# Patient Record
Sex: Female | Born: 1970
Health system: Southern US, Community
[De-identification: ages and names within clinical notes are randomized; demographics above are authoritative.]

## PROBLEM LIST (undated history)

## (undated) DIAGNOSIS — T7840XA Allergy, unspecified, initial encounter: Secondary | ICD-10-CM

## (undated) DIAGNOSIS — K589 Irritable bowel syndrome without diarrhea: Secondary | ICD-10-CM

## (undated) DIAGNOSIS — F32A Depression, unspecified: Secondary | ICD-10-CM

## (undated) DIAGNOSIS — F419 Anxiety disorder, unspecified: Secondary | ICD-10-CM

## (undated) DIAGNOSIS — N3010 Interstitial cystitis (chronic) without hematuria: Secondary | ICD-10-CM

## (undated) DIAGNOSIS — N200 Calculus of kidney: Secondary | ICD-10-CM

## (undated) DIAGNOSIS — Z87442 Personal history of urinary calculi: Secondary | ICD-10-CM

## (undated) DIAGNOSIS — Z8742 Personal history of other diseases of the female genital tract: Secondary | ICD-10-CM

## (undated) DIAGNOSIS — U071 COVID-19: Secondary | ICD-10-CM

## (undated) DIAGNOSIS — Z62810 Personal history of physical and sexual abuse in childhood: Secondary | ICD-10-CM

## (undated) DIAGNOSIS — Z86010 Personal history of colonic polyps: Secondary | ICD-10-CM

## (undated) DIAGNOSIS — M1990 Unspecified osteoarthritis, unspecified site: Secondary | ICD-10-CM

## (undated) DIAGNOSIS — D649 Anemia, unspecified: Secondary | ICD-10-CM

## (undated) DIAGNOSIS — Z8669 Personal history of other diseases of the nervous system and sense organs: Secondary | ICD-10-CM

## (undated) DIAGNOSIS — K219 Gastro-esophageal reflux disease without esophagitis: Secondary | ICD-10-CM

## (undated) DIAGNOSIS — F329 Major depressive disorder, single episode, unspecified: Secondary | ICD-10-CM

## (undated) HISTORY — DX: Irritable bowel syndrome, unspecified: K58.9

## (undated) HISTORY — DX: Depression, unspecified: F32.A

## (undated) HISTORY — DX: Unspecified osteoarthritis, unspecified site: M19.90

## (undated) HISTORY — DX: Gastro-esophageal reflux disease without esophagitis: K21.9

## (undated) HISTORY — DX: Personal history of physical and sexual abuse in childhood: Z62.810

## (undated) HISTORY — DX: Interstitial cystitis (chronic) without hematuria: N30.10

## (undated) HISTORY — DX: Personal history of other diseases of the female genital tract: Z87.42

## (undated) HISTORY — PX: EXPLORATORY LAPAROTOMY: SUR591

## (undated) HISTORY — DX: Calculus of kidney: N20.0

## (undated) HISTORY — PX: ABDOMINAL HYSTERECTOMY: SHX81

## (undated) HISTORY — DX: Personal history of colonic polyps: Z86.010

## (undated) HISTORY — DX: Anemia, unspecified: D64.9

## (undated) HISTORY — PX: UPPER GASTROINTESTINAL ENDOSCOPY: SHX188

## (undated) HISTORY — DX: Major depressive disorder, single episode, unspecified: F32.9

## (undated) HISTORY — PX: ESOPHAGOGASTRODUODENOSCOPY: SHX1529

## (undated) HISTORY — DX: COVID-19: U07.1

## (undated) HISTORY — DX: Personal history of other diseases of the nervous system and sense organs: Z86.69

## (undated) HISTORY — PX: TUBAL LIGATION: SHX77

## (undated) HISTORY — DX: Allergy, unspecified, initial encounter: T78.40XA

## (undated) HISTORY — DX: Anxiety disorder, unspecified: F41.9

## (undated) HISTORY — PX: CHOLECYSTECTOMY: SHX55

## (undated) HISTORY — PX: OTHER SURGICAL HISTORY: SHX169

## (undated) HISTORY — PX: WISDOM TOOTH EXTRACTION: SHX21

---

## 1997-12-19 ENCOUNTER — Other Ambulatory Visit: Admission: RE | Admit: 1997-12-19 | Discharge: 1997-12-19 | Payer: Self-pay | Admitting: *Deleted

## 2008-11-08 HISTORY — PX: ABDOMINAL HYSTERECTOMY: SUR658

## 2009-11-23 ENCOUNTER — Ambulatory Visit (HOSPITAL_COMMUNITY): Admission: RE | Admit: 2009-11-23 | Discharge: 2009-11-23 | Payer: Self-pay | Admitting: Family Medicine

## 2009-11-26 ENCOUNTER — Ambulatory Visit (HOSPITAL_BASED_OUTPATIENT_CLINIC_OR_DEPARTMENT_OTHER): Admission: RE | Admit: 2009-11-26 | Discharge: 2009-11-26 | Payer: Self-pay | Admitting: Urology

## 2010-01-04 ENCOUNTER — Encounter (INDEPENDENT_AMBULATORY_CARE_PROVIDER_SITE_OTHER): Payer: Self-pay | Admitting: Urology

## 2010-01-04 ENCOUNTER — Inpatient Hospital Stay (HOSPITAL_COMMUNITY): Admission: RE | Admit: 2010-01-04 | Discharge: 2010-01-05 | Payer: Self-pay | Admitting: Urology

## 2010-05-28 ENCOUNTER — Ambulatory Visit (HOSPITAL_COMMUNITY): Admission: RE | Admit: 2010-05-28 | Discharge: 2010-05-28 | Payer: Self-pay | Admitting: Urology

## 2010-08-01 ENCOUNTER — Encounter: Payer: Self-pay | Admitting: Family Medicine

## 2010-09-26 LAB — BASIC METABOLIC PANEL
BUN: 3 mg/dL — ABNORMAL LOW (ref 6–23)
BUN: 4 mg/dL — ABNORMAL LOW (ref 6–23)
BUN: 9 mg/dL (ref 6–23)
CO2: 29 mEq/L (ref 19–32)
CO2: 30 mEq/L (ref 19–32)
CO2: 32 mEq/L (ref 19–32)
Calcium: 8.6 mg/dL (ref 8.4–10.5)
Calcium: 8.6 mg/dL (ref 8.4–10.5)
Calcium: 9.7 mg/dL (ref 8.4–10.5)
Chloride: 101 mEq/L (ref 96–112)
Chloride: 107 mEq/L (ref 96–112)
Chloride: 107 mEq/L (ref 96–112)
Creatinine, Ser: 0.65 mg/dL (ref 0.4–1.2)
Creatinine, Ser: 0.79 mg/dL (ref 0.4–1.2)
Creatinine, Ser: 0.82 mg/dL (ref 0.4–1.2)
GFR calc Af Amer: >60 mL/min (ref >60)
GFR calc Af Amer: >60 mL/min (ref >60)
GFR calc Af Amer: >60 mL/min (ref >60)
GFR calc non Af Amer: >60 mL/min (ref >60)
GFR calc non Af Amer: >60 mL/min (ref >60)
GFR calc non Af Amer: >60 mL/min (ref >60)
Glucose, Bld: 142 mg/dL — ABNORMAL HIGH (ref 70–99)
Glucose, Bld: 180 mg/dL — ABNORMAL HIGH (ref 70–99)
Glucose, Bld: 86 mg/dL (ref 70–99)
Potassium: 3.8 mEq/L (ref 3.5–5.1)
Potassium: 3.8 mEq/L (ref 3.5–5.1)
Potassium: 4.1 mEq/L (ref 3.5–5.1)
Sodium: 140 mEq/L (ref 135–145)
Sodium: 141 mEq/L (ref 135–145)
Sodium: 142 mEq/L (ref 135–145)

## 2010-09-26 LAB — CBC
HCT: 39 % (ref 36.0–46.0)
Hemoglobin: 12.8 g/dL (ref 12.0–15.0)
MCHC: 32.7 g/dL (ref 30.0–36.0)
MCV: 89.4 fL (ref 78.0–100.0)
Platelets: 275 10*3/uL (ref 150–400)
RBC: 4.36 MIL/uL (ref 3.87–5.11)
RDW: 14.2 % (ref 11.5–15.5)
WBC: 8 10*3/uL (ref 4.0–10.5)

## 2010-09-26 LAB — HEMOGLOBIN AND HEMATOCRIT, BLOOD
HCT: 33.3 % — ABNORMAL LOW (ref 36.0–46.0)
HCT: 34.4 % — ABNORMAL LOW (ref 36.0–46.0)
Hemoglobin: 11.4 g/dL — ABNORMAL LOW (ref 12.0–15.0)
Hemoglobin: 11.6 g/dL — ABNORMAL LOW (ref 12.0–15.0)

## 2010-09-26 LAB — CREATININE, FLUID (PLEURAL, PERITONEAL, JP DRAINAGE): Creat, Fluid: 0.6 mg/dL

## 2010-09-26 LAB — ABO/RH: ABO/RH(D): A POS

## 2010-09-26 LAB — TYPE AND SCREEN
ABO/RH(D): A POS
Antibody Screen: NEGATIVE

## 2010-09-26 LAB — SURGICAL PCR SCREEN
MRSA, PCR: NEGATIVE
Staphylococcus aureus: NEGATIVE

## 2010-09-27 LAB — POCT HEMOGLOBIN-HEMACUE: Hemoglobin: 12.2 g/dL (ref 12.0–15.0)

## 2012-02-28 ENCOUNTER — Encounter: Payer: Self-pay | Admitting: Internal Medicine

## 2012-03-27 ENCOUNTER — Ambulatory Visit: Payer: Self-pay | Admitting: Internal Medicine

## 2012-03-30 ENCOUNTER — Ambulatory Visit: Payer: Self-pay | Admitting: Internal Medicine

## 2012-04-27 ENCOUNTER — Encounter: Payer: Self-pay | Admitting: Internal Medicine

## 2012-04-27 ENCOUNTER — Ambulatory Visit (INDEPENDENT_AMBULATORY_CARE_PROVIDER_SITE_OTHER): Payer: BC Managed Care – PPO | Admitting: Internal Medicine

## 2012-04-27 ENCOUNTER — Other Ambulatory Visit (INDEPENDENT_AMBULATORY_CARE_PROVIDER_SITE_OTHER): Payer: BC Managed Care – PPO

## 2012-04-27 VITALS — BP 102/78 | HR 68 | Wt 174.0 lb

## 2012-04-27 DIAGNOSIS — K2289 Other specified disease of esophagus: Secondary | ICD-10-CM

## 2012-04-27 DIAGNOSIS — K589 Irritable bowel syndrome without diarrhea: Secondary | ICD-10-CM

## 2012-04-27 DIAGNOSIS — K228 Other specified diseases of esophagus: Secondary | ICD-10-CM

## 2012-04-27 LAB — CBC WITH DIFFERENTIAL/PLATELET
Basophils Absolute: 0.1 10*3/uL (ref 0.0–0.1)
Basophils Relative: 1 % (ref 0.0–3.0)
Eosinophils Absolute: 0.1 10*3/uL (ref 0.0–0.7)
Eosinophils Relative: 1.6 % (ref 0.0–5.0)
HCT: 40.2 % (ref 36.0–46.0)
Hemoglobin: 13.2 g/dL (ref 12.0–15.0)
Lymphocytes Relative: 21 % (ref 12.0–46.0)
Lymphs Abs: 1.9 10*3/uL (ref 0.7–4.0)
MCHC: 32.9 g/dL (ref 30.0–36.0)
MCV: 85.6 fl (ref 78.0–100.0)
Monocytes Absolute: 0.6 10*3/uL (ref 0.1–1.0)
Monocytes Relative: 6.8 % (ref 3.0–12.0)
Neutro Abs: 6.2 10*3/uL (ref 1.4–7.7)
Neutrophils Relative %: 69.6 % (ref 43.0–77.0)
Platelets: 340 10*3/uL (ref 150.0–400.0)
RBC: 4.7 Mil/uL (ref 3.87–5.11)
RDW: 14.9 % — ABNORMAL HIGH (ref 11.5–14.6)
WBC: 8.8 10*3/uL (ref 4.5–10.5)

## 2012-04-27 LAB — IGA: IgA: 162 mg/dL (ref 68–378)

## 2012-04-27 MED ORDER — GLYCOPYRROLATE 2 MG PO TABS: 2.0000 mg | ORAL_TABLET | Freq: Two times a day (BID) | ORAL | Status: DC

## 2012-04-27 NOTE — Progress Notes (Signed)
Subjective:    Patient ID: Nancy Armstrong, female    DOB: [birth year], 41 y.o.   MRN: [id number] Referred by: Blane Ohara, MD  HPI This is a very pleasant 41 year old divorced white woman who presents with abdominal pain and esophageal issues.  She describes a chronic history of intermittent lower abdominal pain, associated with irregular bowel movements. She'll either be constipated and not move her bowels for multiple days or have multiple loose or soft bowel movements at times. She also frequently has a long gastrocolic reflex. In the past month or so things have been particularly worse. She feels crampy and tender in the lower abdomen bilaterally. She has some associated dizziness and weakness today. She has not had rectal bleeding. She tells me she had a CT urogram performed within the last few weeks that was unrevealing.  She also has a chronic 10 year history of intermittent spells of central chest pain that she says feels like a heart attack, and her associated with acute sensation of dysphagia though she's not swallowing. She has been on chronic PPI therapy including Nexium which she's on now Prilosec and other antacids without relief. However if she uses a chewable Pepcid a.c. when she gets one of these episodes she will get relief. She does not have trouble swallowing food.  Had an upper GI endoscopy about 10 years ago for that problem but she's never had lower GI endoscopy. She was seen in September 20 and treated for sinusitis. She also had a urine culture positive for Escherichia coli at that time. H. pylori antibody was negative. It was an IgA antibody.  She does have interstitial cystitis but says it is controlled with diet at this point and the symptoms she has now are nothing like what interstitial cystitis causes for her. Allergies  Allergen Reactions  . Ciprofloxacin   . Imitrex (Sumatriptan)     Paresthesia of extremities  . Relpax (Eletriptan)     Sore throat, swelling   .  Topamax (Topiramate)     Edema,sore throat  . Zomig (Zolmitriptan)     Swelling    Outpatient Prescriptions Prior to Visit  Medication Sig Dispense Refill  . butalbital-acetaminophen-caffeine (FIORICET) 50-325-40 MG per tablet Take 1 tablet by mouth every 4 (four) hours as needed.      . cyclobenzaprine (FLEXERIL) 5 MG tablet Take 5 mg by mouth 3 (three) times daily as needed.      Marland Kitchen esomeprazole (NEXIUM) 40 MG capsule Take 40 mg by mouth daily before breakfast.      . HYDROcodone-acetaminophen (VICODIN) 5-500 MG per tablet Take 1 tablet by mouth every 6 (six) hours as needed.      . meloxicam (MOBIC) 15 MG tablet Take 15 mg by mouth daily.      . promethazine (PHENERGAN) 25 MG tablet Take 25 mg by mouth every 6 (six) hours as needed.      . sertraline (ZOLOFT) 100 MG tablet Take 100 mg by mouth daily.      Marland Kitchen zolpidem (AMBIEN) 5 MG tablet Take 5 mg by mouth at bedtime as needed.       Past Medical History  Diagnosis Date  . Hx of migraine headaches   . Renal stones   . Hx of endometriosis   . Arthritis   . Interstitial cystitis    Past Surgical History  Procedure Date  . Cholecystectomy   . Tubal ligation   . Exploratory laparotomy     ovarian cyst/endometriosis  . Upj  reconstruction   . Renal stone extraction   . Abdominal hysterectomy 11/2008    BSO  . Esophagogastroduodenoscopy     [name]   History   Social History  . Marital Status: Divorced    Spouse Name: N/A    Number of Children: 3  . Years of Education: N/A   Occupational History  . Multimedia programmer   Social History Main Topics  . Smoking status: Never Smoker   . Smokeless tobacco: Never Used  . Alcohol Use: Yes  . Drug Use: No  .            Social History Narrative   Divorced, 2 sons and 1 daughter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   Family History  Problem Relation Age of Onset  . Hypertension Mother   . Lung cancer Paternal Grandmother   . Liver cancer Maternal Grandmother   .  Hypertension Father   . Lung cancer Maternal Grandmother         Review of Systems This is positive for fatigue intermittent headaches tinnitus insomnia and arthritis pains. All other review of systems are negative.    Objective:   Physical Exam General:  Well-developed, well-nourished and in no acute distress Eyes:  anicteric. ENT:   Mouth and posterior pharynx free of lesions.  Neck:   supple w/o thyromegaly or mass.  Lungs: Clear to auscultation bilaterally. Heart:  S1S2, no rubs, murmurs, gallops. Abdomen:  soft, diffusely tender but especially in the lower quadrants were there is mild to moderate tenderness., no hepatosplenomegaly, hernia, or mass and BS+. No rebound or guarding. Lymph:  no cervical or supraclavicular adenopathy. Extremities:   no edema Skin   no rash. Neuro:  A&O x 3.  Psych:  appropriate mood and  Affect.   Data Reviewed: As per history of present illness. Will obtain copy of the CT report.     Assessment & Plan:   1. IBS (irritable bowel syndrome)   I think her story is classic for this. There is an outside chance she could have something like celiac disease or inflammatory bowel disease but seems unlikely. It is quite common have IBS in conjunction with interstitial cystitis.   2. Esophageal pain   I'm not sure what this is but she may have intermittent esophageal spasm. She has dealt with this for many years and has reliable relief with intermittent chewable Pepcid. She only gets symptoms a few times a year it sounds like. We'll observe at this point and focus on #1.    1. CBC, IgA level and tissue transglutaminase level. 2. Start glycopyrrolate 2 mg twice daily 3. Fiber supplementation with psyllium 1 teaspoon daily increasing to 1 tablespoon daily 4. Return to me in 6 weeks, call back sooner if needed 5. IBS handout provided  I appreciate the opportunity to care for this patient.   CC: Blane Ohara, MD

## 2012-04-27 NOTE — Patient Instructions (Addendum)
Your physician has requested that you go to the basement for the following lab work before leaving today: CBC/diff, IGA, TTG  Start over the counter Metamucil :  Take one teaspoon every day and build up to one tablespoon every day over the next week.  We have sent the following medications to your pharmacy for you to pick up at your convenience: Robinul  We will obtain the CT scan results with the ROI you signed today.  Thank you for choosing me and [facility].  Iva Boop, M.D., [facility]

## 2012-04-30 LAB — TISSUE TRANSGLUTAMINASE, IGA: Tissue Transglutaminase Ab, IgA: 2.5 U/mL (ref <20)

## 2012-05-01 NOTE — Progress Notes (Signed)
Quick Note:  Test is negative for celiac disease ______

## 2012-10-29 ENCOUNTER — Encounter: Payer: Self-pay | Admitting: Gastroenterology

## 2012-10-29 ENCOUNTER — Ambulatory Visit (HOSPITAL_COMMUNITY)
Admission: RE | Admit: 2012-10-29 | Discharge: 2012-10-29 | Disposition: A | Discharge status: Home / Self Care | Payer: BC Managed Care – PPO | Source: Ambulatory Visit | Attending: Gastroenterology | Admitting: Gastroenterology

## 2012-10-29 ENCOUNTER — Telehealth: Payer: Self-pay | Admitting: Internal Medicine

## 2012-10-29 ENCOUNTER — Encounter: Payer: Self-pay | Admitting: *Deleted

## 2012-10-29 ENCOUNTER — Telehealth: Payer: Self-pay | Admitting: Gastroenterology

## 2012-10-29 ENCOUNTER — Ambulatory Visit (INDEPENDENT_AMBULATORY_CARE_PROVIDER_SITE_OTHER): Payer: BC Managed Care – PPO | Admitting: Gastroenterology

## 2012-10-29 ENCOUNTER — Other Ambulatory Visit: Payer: BC Managed Care – PPO

## 2012-10-29 VITALS — BP 110/70 | HR 80 | Ht 64.0 in | Wt 186.8 lb

## 2012-10-29 DIAGNOSIS — R7989 Other specified abnormal findings of blood chemistry: Secondary | ICD-10-CM

## 2012-10-29 DIAGNOSIS — R0789 Other chest pain: Secondary | ICD-10-CM

## 2012-10-29 DIAGNOSIS — R1013 Epigastric pain: Secondary | ICD-10-CM

## 2012-10-29 DIAGNOSIS — Z9089 Acquired absence of other organs: Secondary | ICD-10-CM | POA: Insufficient documentation

## 2012-10-29 DIAGNOSIS — N281 Cyst of kidney, acquired: Secondary | ICD-10-CM | POA: Insufficient documentation

## 2012-10-29 LAB — HEPATIC FUNCTION PANEL
ALT: 193 U/L — ABNORMAL HIGH (ref 0–35)
AST: 53 U/L — ABNORMAL HIGH (ref 0–37)
Albumin: 4.4 g/dL (ref 3.5–5.2)
Alkaline Phosphatase: 157 U/L — ABNORMAL HIGH (ref 39–117)
Bilirubin, Direct: 0.1 mg/dL (ref 0.0–0.3)
Total Bilirubin: 0.4 mg/dL (ref 0.3–1.2)
Total Protein: 8.2 g/dL (ref 6.0–8.3)

## 2012-10-29 NOTE — Telephone Encounter (Signed)
Spoke with patient.  Informed her that liver tests have come down significantly, but not yet normal.  Ultrasound is normal, however, we strongly suspect that she passed a gallstone.  Plan is to see her back in two weeks with LFT's drawn prior to visit.  She is to slowly start back on a diet, advancing as tolerated.  She was told to have her LFT's drawn if she ever experiences this pain again.  If she has documented recurrence, then could argue for ERCP/EUS with sphincterotomy.  Patient is in agreement.

## 2012-10-29 NOTE — Patient Instructions (Addendum)
You have been scheduled for an abdominal ultrasound at [facility] (1st floor of hospital) on TODAY at 2:30PM. Please arrive 15 minutes prior to your appointment for registration. Make certain not to have anything to eat or drink 6 hours prior to your appointment. Should you need to reschedule your appointment, please contact radiology at [phone]. This test typically takes about 30 minutes to perform.  Go to the basement for stat labs today

## 2012-10-29 NOTE — Progress Notes (Addendum)
10/29/2012 Nancy Armstrong [id number] 1970/07/29   History of Present Illness:  Patient is a 42 year old female who is a patient of Dr. Marvell Fuller who presents to our office today for follow-up after an ER visit on 4/17.  She says that early that morning around 230 am she woke up from chest and upper abdominal pains that reached an 8/10 in severity.  She had similar symptoms in the past and has been treated for reflux; she tried taking the typical medications that she would use with previous episodes, however, nothing was helping and that was unusual.  Pain was across her upper abdomen, into her chest, and radiating to the right side of her back.  She had some nausea but no vomiting.  Went to the ED and was found to have a mild leukocytosis along with very elevated LFT's (AST 675, ALT 365, and ALP 162 but normal bili).  Lipase was normal.  She was given phenergan and pain medication along with prilosec to begin taking daily.  She says that the pain has subsided over the past couple of days, but her abdomen is still sore and she just does not feel well.  She saw her PCP this AM and was given samples of Dexilant to begin taking in place of the prilosec.  She was given more pain medication.  Labs were drawn for Lyme's disease, RMSF, and Malachi Carl.  Her acute hepatitis panel was negative.  Current Medications, Allergies, Past Medical History, Past Surgical History, Family History and Social History were reviewed in [employer] record.   Physical Exam: Wt 186 lb 12.8 oz (84.732 kg) General: Well developed, white female in no acute distress Head: Normocephalic and atraumatic Eyes:  sclerae anicteric, conjunctiva pink  Ears: Normal auditory acuity Lungs: Clear throughout to auscultation Heart: Regular rate and rhythm Abdomen: Soft, non tender and non distended. No masses, no hepatomegaly. Normal bowel sounds Musculoskeletal: Symmetrical with no gross deformities  Extremities: No  edema  Neurological: Alert oriented x 4, grossly nonfocal Psychological:  Alert and cooperative. Normal mood and affect  Assessment and Recommendations: -Chest pain and epigastric abdominal pain:  She thought that it was reflux related, but was worse and different than the pain that she had with the reflux previously (pain on this occasion also not relieved by her usually treatment).   -Elevated LFT's:  ? If this is related to or independent of the abdominal pain that she was/is having.  She is on antibiotics (was on zithromax for two days and now on omnicef)--? If one of these caused hepatotoxicity  *Rule out choledocholithiasis.  We will recheck STAT LFT's this morning and schedule an ultrasound for today. Further evaluation and treatment pending the results of LFT's and ultrasound.  In the interim, she will begin the Dexilant samples that she was given by her PCP; will continue anti-emetics and pain medication as needed.   Addendum: Reviewed and agree with initial management. Agree with PPI trial. Given symptoms and LFTs, but initially impression is that all are related. Await [place], consider MRCP if symptoms persist. Trend LFTs Beverley Fiedler, MD

## 2012-10-29 NOTE — Telephone Encounter (Signed)
Patient was seen in the [facility] ER on Friday with a reflux attack and chest pain.  She will come in this am and see Doug Sou, PA at 10:45

## 2012-10-30 ENCOUNTER — Other Ambulatory Visit: Payer: Self-pay | Admitting: *Deleted

## 2012-10-30 ENCOUNTER — Telehealth: Payer: Self-pay | Admitting: Gastroenterology

## 2012-10-30 ENCOUNTER — Other Ambulatory Visit: Payer: BC Managed Care – PPO

## 2012-10-30 DIAGNOSIS — R1011 Right upper quadrant pain: Secondary | ICD-10-CM

## 2012-10-30 DIAGNOSIS — R7989 Other specified abnormal findings of blood chemistry: Secondary | ICD-10-CM

## 2012-10-30 DIAGNOSIS — R109 Unspecified abdominal pain: Secondary | ICD-10-CM

## 2012-10-30 LAB — HEPATIC FUNCTION PANEL
ALT: 179 U/L — ABNORMAL HIGH (ref 0–35)
AST: 68 U/L — ABNORMAL HIGH (ref 0–37)
Albumin: 4.2 g/dL (ref 3.5–5.2)
Alkaline Phosphatase: 155 U/L — ABNORMAL HIGH (ref 39–117)
Bilirubin, Direct: 0 mg/dL (ref 0.0–0.3)
Total Bilirubin: 0.5 mg/dL (ref 0.3–1.2)
Total Protein: 7.9 g/dL (ref 6.0–8.3)

## 2012-10-30 NOTE — Telephone Encounter (Signed)
You have been scheduled for an MRI at [facility] on 11/02/2012. Your appointment time is 1PM. Please arrive 15 minutes prior to your appointment time for registration purposes. There is no prep for this test. However, if you have any metal in your body, have a pacemaker or defibrillator, please be sure to let your ordering physician know. This test typically takes 45 minutes to 1 hour to complete. NOTHING TO EAT OR DRINK 4 HOURS BEFORE

## 2012-10-30 NOTE — Telephone Encounter (Signed)
Nancy Armstrong,       I spoke with patient and gave her results and plan. Could you please put a standing order in the system for LFT's for her. Also, she needs a follow-up with me in two weeks; could you please schedule that for her as well and make her aware of appointment date and time? She is supposed to have LFT's drawn prior to that visit as well.      Thank you,      Jess      Attached to   Jess, I just got your message...., What do you want to do about this She is having a lot of pain see message

## 2012-10-30 NOTE — Telephone Encounter (Signed)
PT ON THE WAY HERE TO HAVE THEM DRAWN. DID NOT WANT TO GO TO PCP TO HAVE THEM DRAWN

## 2012-10-30 NOTE — Telephone Encounter (Signed)
Recheck LFT's this morning.

## 2012-10-30 NOTE — Telephone Encounter (Signed)
PT WAITING IN LOBBY Nancy Armstrong

## 2012-11-01 ENCOUNTER — Telehealth: Payer: Self-pay | Admitting: Gastroenterology

## 2012-11-01 NOTE — Telephone Encounter (Signed)
Spoke with patient and her PCP's lab work was positive for mono. Per Shanda Bumps cancel MRI. Cancelled MRI. Patient aware.

## 2012-11-01 NOTE — Telephone Encounter (Signed)
Spoke with Doug Sou, PA. If patient is being treated for acute mono, this could account for symptoms and lab values. May cancel MRI. Left a message for patient to call me.

## 2012-11-02 ENCOUNTER — Ambulatory Visit (HOSPITAL_COMMUNITY): Admission: RE | Admit: 2012-11-02 | Payer: BC Managed Care – PPO | Source: Ambulatory Visit

## 2013-05-16 ENCOUNTER — Other Ambulatory Visit: Payer: Self-pay

## 2014-04-25 ENCOUNTER — Other Ambulatory Visit: Payer: Self-pay

## 2016-08-11 DIAGNOSIS — J029 Acute pharyngitis, unspecified: Secondary | ICD-10-CM | POA: Diagnosis not present

## 2016-08-11 DIAGNOSIS — Z20828 Contact with and (suspected) exposure to other viral communicable diseases: Secondary | ICD-10-CM | POA: Diagnosis not present

## 2016-08-11 DIAGNOSIS — J069 Acute upper respiratory infection, unspecified: Secondary | ICD-10-CM | POA: Diagnosis not present

## 2016-08-11 DIAGNOSIS — M5090 Cervical disc disorder, unspecified, unspecified cervical region: Secondary | ICD-10-CM | POA: Diagnosis not present

## 2016-08-11 DIAGNOSIS — M5412 Radiculopathy, cervical region: Secondary | ICD-10-CM | POA: Diagnosis not present

## 2016-08-11 DIAGNOSIS — R6889 Other general symptoms and signs: Secondary | ICD-10-CM | POA: Diagnosis not present

## 2016-08-11 DIAGNOSIS — M545 Low back pain: Secondary | ICD-10-CM | POA: Diagnosis not present

## 2016-08-11 DIAGNOSIS — M47812 Spondylosis without myelopathy or radiculopathy, cervical region: Secondary | ICD-10-CM | POA: Diagnosis not present

## 2016-08-20 DIAGNOSIS — J218 Acute bronchiolitis due to other specified organisms: Secondary | ICD-10-CM | POA: Diagnosis not present

## 2016-08-20 DIAGNOSIS — J0100 Acute maxillary sinusitis, unspecified: Secondary | ICD-10-CM | POA: Diagnosis not present

## 2016-08-26 DIAGNOSIS — R071 Chest pain on breathing: Secondary | ICD-10-CM | POA: Diagnosis not present

## 2016-08-26 DIAGNOSIS — M62838 Other muscle spasm: Secondary | ICD-10-CM | POA: Diagnosis not present

## 2016-08-26 DIAGNOSIS — K209 Esophagitis, unspecified: Secondary | ICD-10-CM | POA: Diagnosis not present

## 2016-09-12 DIAGNOSIS — M5412 Radiculopathy, cervical region: Secondary | ICD-10-CM | POA: Diagnosis not present

## 2016-09-12 DIAGNOSIS — M545 Low back pain: Secondary | ICD-10-CM | POA: Diagnosis not present

## 2016-09-12 DIAGNOSIS — M47812 Spondylosis without myelopathy or radiculopathy, cervical region: Secondary | ICD-10-CM | POA: Diagnosis not present

## 2016-09-12 DIAGNOSIS — M5090 Cervical disc disorder, unspecified, unspecified cervical region: Secondary | ICD-10-CM | POA: Diagnosis not present

## 2016-09-26 DIAGNOSIS — E860 Dehydration: Secondary | ICD-10-CM | POA: Diagnosis not present

## 2016-09-26 DIAGNOSIS — Z888 Allergy status to other drugs, medicaments and biological substances status: Secondary | ICD-10-CM | POA: Diagnosis not present

## 2016-09-26 DIAGNOSIS — N390 Urinary tract infection, site not specified: Secondary | ICD-10-CM | POA: Diagnosis not present

## 2016-09-26 DIAGNOSIS — Z87442 Personal history of urinary calculi: Secondary | ICD-10-CM | POA: Diagnosis not present

## 2016-09-26 DIAGNOSIS — Z87440 Personal history of urinary (tract) infections: Secondary | ICD-10-CM | POA: Diagnosis not present

## 2016-09-26 DIAGNOSIS — M797 Fibromyalgia: Secondary | ICD-10-CM | POA: Diagnosis not present

## 2016-09-26 DIAGNOSIS — N3000 Acute cystitis without hematuria: Secondary | ICD-10-CM | POA: Diagnosis not present

## 2016-09-26 DIAGNOSIS — N200 Calculus of kidney: Secondary | ICD-10-CM | POA: Diagnosis not present

## 2016-09-26 DIAGNOSIS — K567 Ileus, unspecified: Secondary | ICD-10-CM | POA: Diagnosis not present

## 2016-09-26 DIAGNOSIS — M479 Spondylosis, unspecified: Secondary | ICD-10-CM | POA: Diagnosis not present

## 2016-09-26 DIAGNOSIS — Z79899 Other long term (current) drug therapy: Secondary | ICD-10-CM | POA: Diagnosis not present

## 2016-09-26 DIAGNOSIS — G43909 Migraine, unspecified, not intractable, without status migrainosus: Secondary | ICD-10-CM | POA: Diagnosis not present

## 2016-09-26 DIAGNOSIS — R1033 Periumbilical pain: Secondary | ICD-10-CM | POA: Diagnosis not present

## 2016-09-26 DIAGNOSIS — Z881 Allergy status to other antibiotic agents status: Secondary | ICD-10-CM | POA: Diagnosis not present

## 2016-09-26 DIAGNOSIS — R109 Unspecified abdominal pain: Secondary | ICD-10-CM | POA: Diagnosis not present

## 2016-09-26 DIAGNOSIS — A084 Viral intestinal infection, unspecified: Secondary | ICD-10-CM | POA: Diagnosis not present

## 2016-09-26 DIAGNOSIS — K589 Irritable bowel syndrome without diarrhea: Secondary | ICD-10-CM | POA: Diagnosis not present

## 2016-09-27 DIAGNOSIS — N390 Urinary tract infection, site not specified: Secondary | ICD-10-CM | POA: Diagnosis not present

## 2016-09-27 DIAGNOSIS — R109 Unspecified abdominal pain: Secondary | ICD-10-CM | POA: Diagnosis not present

## 2016-09-27 DIAGNOSIS — E860 Dehydration: Secondary | ICD-10-CM | POA: Diagnosis not present

## 2016-09-28 DIAGNOSIS — N200 Calculus of kidney: Secondary | ICD-10-CM | POA: Diagnosis not present

## 2016-09-28 DIAGNOSIS — E860 Dehydration: Secondary | ICD-10-CM | POA: Diagnosis not present

## 2016-09-28 DIAGNOSIS — N390 Urinary tract infection, site not specified: Secondary | ICD-10-CM | POA: Diagnosis not present

## 2016-09-28 DIAGNOSIS — A084 Viral intestinal infection, unspecified: Secondary | ICD-10-CM | POA: Diagnosis not present

## 2016-09-28 DIAGNOSIS — K589 Irritable bowel syndrome without diarrhea: Secondary | ICD-10-CM

## 2016-10-11 DIAGNOSIS — M47812 Spondylosis without myelopathy or radiculopathy, cervical region: Secondary | ICD-10-CM | POA: Diagnosis not present

## 2016-10-11 DIAGNOSIS — M5412 Radiculopathy, cervical region: Secondary | ICD-10-CM | POA: Diagnosis not present

## 2016-10-11 DIAGNOSIS — G894 Chronic pain syndrome: Secondary | ICD-10-CM | POA: Diagnosis not present

## 2016-10-11 DIAGNOSIS — M5090 Cervical disc disorder, unspecified, unspecified cervical region: Secondary | ICD-10-CM | POA: Diagnosis not present

## 2016-10-11 DIAGNOSIS — M545 Low back pain: Secondary | ICD-10-CM | POA: Diagnosis not present

## 2016-10-16 ENCOUNTER — Emergency Department (HOSPITAL_COMMUNITY): Payer: BLUE CROSS/BLUE SHIELD

## 2016-10-16 ENCOUNTER — Encounter (HOSPITAL_COMMUNITY): Payer: Self-pay

## 2016-10-16 ENCOUNTER — Emergency Department (HOSPITAL_COMMUNITY)
Admission: EM | Admit: 2016-10-16 | Discharge: 2016-10-16 | Disposition: A | Discharge status: Home / Self Care | Payer: BLUE CROSS/BLUE SHIELD | Attending: Emergency Medicine | Admitting: Emergency Medicine

## 2016-10-16 DIAGNOSIS — R1013 Epigastric pain: Secondary | ICD-10-CM | POA: Diagnosis not present

## 2016-10-16 DIAGNOSIS — R0789 Other chest pain: Secondary | ICD-10-CM | POA: Insufficient documentation

## 2016-10-16 DIAGNOSIS — Z79899 Other long term (current) drug therapy: Secondary | ICD-10-CM | POA: Insufficient documentation

## 2016-10-16 DIAGNOSIS — R071 Chest pain on breathing: Secondary | ICD-10-CM | POA: Diagnosis present

## 2016-10-16 DIAGNOSIS — R079 Chest pain, unspecified: Secondary | ICD-10-CM | POA: Diagnosis not present

## 2016-10-16 LAB — CBC WITH DIFFERENTIAL/PLATELET
Basophils Absolute: 0.1 10*3/uL (ref 0.0–0.1)
Basophils Relative: 1 %
Eosinophils Absolute: 0.3 10*3/uL (ref 0.0–0.7)
Eosinophils Relative: 5 %
HCT: 38.3 % (ref 36.0–46.0)
Hemoglobin: 12.4 g/dL (ref 12.0–15.0)
Lymphocytes Relative: 28 %
Lymphs Abs: 1.9 10*3/uL (ref 0.7–4.0)
MCH: 27.6 pg (ref 26.0–34.0)
MCHC: 32.4 g/dL (ref 30.0–36.0)
MCV: 85.3 fL (ref 78.0–100.0)
Monocytes Absolute: 0.4 10*3/uL (ref 0.1–1.0)
Monocytes Relative: 5 %
Neutro Abs: 4.1 10*3/uL (ref 1.7–7.7)
Neutrophils Relative %: 61 %
Platelets: 305 10*3/uL (ref 150–400)
RBC: 4.49 MIL/uL (ref 3.87–5.11)
RDW: 14.4 % (ref 11.5–15.5)
WBC: 6.7 10*3/uL (ref 4.0–10.5)

## 2016-10-16 LAB — COMPREHENSIVE METABOLIC PANEL
ALT: 16 U/L (ref 14–54)
AST: 17 U/L (ref 15–41)
Albumin: 3.7 g/dL (ref 3.5–5.0)
Alkaline Phosphatase: 102 U/L (ref 38–126)
Anion gap: 8 (ref 5–15)
BUN: 10 mg/dL (ref 6–20)
CO2: 28 mmol/L (ref 22–32)
Calcium: 9.3 mg/dL (ref 8.9–10.3)
Chloride: 103 mmol/L (ref 101–111)
Creatinine, Ser: 0.88 mg/dL (ref 0.44–1.00)
GFR calc Af Amer: >60 mL/min (ref >60)
GFR calc non Af Amer: >60 mL/min (ref >60)
Glucose, Bld: 95 mg/dL (ref 65–99)
Potassium: 3.1 mmol/L — ABNORMAL LOW (ref 3.5–5.1)
Sodium: 139 mmol/L (ref 135–145)
Total Bilirubin: 0.3 mg/dL (ref 0.3–1.2)
Total Protein: 6.9 g/dL (ref 6.5–8.1)

## 2016-10-16 LAB — I-STAT TROPONIN, ED: Troponin i, poc: 0 ng/mL (ref 0.00–0.08)

## 2016-10-16 LAB — D-DIMER, QUANTITATIVE (NOT AT ARMC): D-Dimer, Quant: <0.27 ug/mL-FEU (ref 0.00–0.50)

## 2016-10-16 LAB — LIPASE, BLOOD: Lipase: 17 U/L (ref 11–51)

## 2016-10-16 MED ORDER — SUCRALFATE 1 GM/10ML PO SUSP: 1.0000 g | Freq: Three times a day (TID) | ORAL | 0 refills | Status: DC

## 2016-10-16 MED ORDER — HYDROMORPHONE HCL 1 MG/ML IJ SOLN: 1.0000 mg | Freq: Once | INTRAMUSCULAR | Status: DC

## 2016-10-16 MED ORDER — OMEPRAZOLE 40 MG PO CPDR: 40.0000 mg | DELAYED_RELEASE_CAPSULE | Freq: Every day | ORAL | 0 refills | Status: DC

## 2016-10-16 MED ORDER — GI COCKTAIL ~~LOC~~
30.0000 mL | Freq: Once | ORAL | Status: AC
  Administered 2016-10-16: 30 mL via ORAL
  Filled 2016-10-16: qty 30

## 2016-10-16 MED ORDER — FAMOTIDINE 40 MG PO TABS: 40.0000 mg | ORAL_TABLET | Freq: Every day | ORAL | 0 refills | Status: DC

## 2016-10-16 MED ORDER — HYDROMORPHONE HCL 1 MG/ML IJ SOLN
1.0000 mg | Freq: Once | INTRAMUSCULAR | Status: AC
Start: 2016-10-16 — End: 2016-10-16
  Administered 2016-10-16: 1 mg via INTRAMUSCULAR
  Filled 2016-10-16: qty 1

## 2016-10-16 NOTE — ED Triage Notes (Signed)
Patient complains of back pain between scapulas with movement but more with inspiration. Denies cold and cough. Denies injury. No CP. EKG done on arrival, NAD

## 2016-10-16 NOTE — ED Provider Notes (Signed)
[facility] DEPT Provider Note   CSN: [id number] Arrival date & time: 10/16/16  1157     History   Chief Complaint No chief complaint on file.   HPI Nancy Armstrong is a 46 y.o. female.  HPI 46 year old female with history of chronic pain here with middle back pain. The patient states that for the last 2 days, she has had an aching, gnawing, pressure-like pain in her bilateral shoulders and epigastric area. The pain seems to be worse with deep inspiration but denies any specific aggravating factors. It is not significantly worse with movement or palpation. It does seem to mildly worsened with eating. She just recently hospitalized for GI infection and was reportedly on antibiotics for this. She denies any vomiting but does have some nausea. She has been taking her pain meds without significant relief. She does have a history of chronic NSAID use as well but has never had an EGD or scope. Denies known history of ulcers. Denies any fevers or chills. No lower extremity swelling. She is not on estrogen and denies any leg swelling or recent immobilization or surgeries.   Past Medical History:  Diagnosis Date  . Hx of endometriosis   . Hx of migraine headaches   . Interstitial cystitis   . Osteoarthritis    neck  . Renal stones     Patient Active Problem List   Diagnosis Date Noted  . Elevated LFTs 10/29/2012  . Abdominal pain, epigastric 10/29/2012  . Atypical chest pain 10/29/2012  . IBS (irritable bowel syndrome) 04/27/2012  . Esophageal pain 04/27/2012    Past Surgical History:  Procedure Laterality Date  . ABDOMINAL HYSTERECTOMY  11/2008   BSO  . CHOLECYSTECTOMY    . ESOPHAGOGASTRODUODENOSCOPY     [name]  . EXPLORATORY LAPAROTOMY     ovarian cyst/endometriosis  . renal stone extraction    . TUBAL LIGATION    . UPJ reconstruction      OB History    No data available       Home Medications    Prior to Admission medications   Medication Sig Start Date End Date  Taking? Authorizing Provider  butalbital-acetaminophen-caffeine (FIORICET) 50-325-40 MG per tablet Take 1 tablet by mouth every 4 (four) hours as needed.    Historical Provider, MD  carisoprodol (SOMA) 350 MG tablet Take 350 mg by mouth 2 (two) times daily.    Historical Provider, MD  cefdinir (OMNICEF) 300 MG capsule Take 300 mg by mouth 2 (two) times daily.    Historical Provider, MD  Chlorphen-PE-Acetaminophen (NOREL AD PO) Take 1 tablet by mouth 2 (two) times daily.    Historical Provider, MD  Diclofenac (ZORVOLEX) 35 MG CAPS Take 1 capsule by mouth 3 (three) times daily.    Historical Provider, MD  famotidine (PEPCID) 40 MG tablet Take 1 tablet (40 mg total) by mouth daily. 10/16/16   Duffy Bruce, MD  glycopyrrolate (ROBINUL) 2 MG tablet Take 1 tablet (2 mg total) by mouth 2 (two) times daily. 04/27/12   Gatha Mayer, MD  omeprazole (PRILOSEC) 40 MG capsule Take 1 capsule (40 mg total) by mouth daily. 10/16/16 10/30/16  Duffy Bruce, MD  oxyCODONE-acetaminophen (PERCOCET/ROXICET) 5-325 MG per tablet Take 1 tablet by mouth daily.    Historical Provider, MD  promethazine (PHENERGAN) 25 MG tablet Take 25 mg by mouth every 6 (six) hours as needed.    Historical Provider, MD  sertraline (ZOLOFT) 25 MG tablet Take 25 mg by mouth daily.  Historical Provider, MD  sucralfate (CARAFATE) 1 GM/10ML suspension Take 10 mLs (1 g total) by mouth 4 (four) times daily -  with meals and at bedtime. 10/16/16   Duffy Bruce, MD  zolpidem (AMBIEN) 5 MG tablet Take 5 mg by mouth at bedtime as needed.    Historical Provider, MD    Family History Family History  Problem Relation Age of Onset  . Hypertension Mother   . Lung cancer Paternal Grandmother   . Liver cancer Maternal Grandmother   . Lung cancer Maternal Grandmother   . Hypertension Father   . Colon cancer Neg Hx     Social History Social History  Substance Use Topics  . Smoking status: Never Smoker  . Smokeless tobacco: Never Used  . Alcohol  use 0.0 oz/week     Comment: very rare     Allergies   Ciprofloxacin; Imitrex [sumatriptan]; Levaquin [levofloxacin]; Relpax [eletriptan]; Topamax [topiramate]; and Zomig [zolmitriptan]   Review of Systems Review of Systems  Constitutional: Positive for fatigue. Negative for chills and fever.  HENT: Negative for congestion, rhinorrhea and sore throat.   Eyes: Negative for visual disturbance.  Respiratory: Positive for chest tightness and shortness of breath. Negative for cough and wheezing.   Cardiovascular: Negative for chest pain and leg swelling.  Gastrointestinal: Positive for abdominal pain and nausea. Negative for diarrhea and vomiting.  Genitourinary: Negative for dysuria, flank pain, vaginal bleeding and vaginal discharge.  Musculoskeletal: Negative for neck pain.  Skin: Negative for rash.  Allergic/Immunologic: Negative for immunocompromised state.  Neurological: Negative for syncope and headaches.  Hematological: Does not bruise/bleed easily.  All other systems reviewed and are negative.    Physical Exam Updated Vital Signs BP 115/76   Pulse 71   Temp 97.8 F (36.6 C) (Oral)   Resp 13   SpO2 95%   Physical Exam  Constitutional: She is oriented to person, place, and time. She appears well-developed and well-nourished. No distress.  HENT:  Head: Normocephalic and atraumatic.  Eyes: Conjunctivae are normal.  Neck: Neck supple.  Cardiovascular: Normal rate, regular rhythm and normal heart sounds.  Exam reveals no friction rub.   No murmur heard. Pulmonary/Chest: Effort normal and breath sounds normal. No respiratory distress. She has no wheezes. She has no rales.  Abdominal: Soft. She exhibits no distension. There is tenderness (Moderate epigastric tenderness without rebound or guarding. No Murphy sign.). There is no rebound and no guarding.  Musculoskeletal: She exhibits no edema.  Moderate tenderness to palpation of her midline lower cervical spine and  paraspinal muscles. No bruising or deformity.  Neurological: She is alert and oriented to person, place, and time. She exhibits normal muscle tone.  Skin: Skin is warm. Capillary refill takes less than 2 seconds.  Psychiatric: She has a normal mood and affect.  Nursing note and vitals reviewed.    ED Treatments / Results  Labs (all labs ordered are listed, but only abnormal results are displayed) Labs Reviewed  COMPREHENSIVE METABOLIC PANEL - Abnormal; Notable for the following:       Result Value   Potassium 3.1 (*)    All other components within normal limits  CBC WITH DIFFERENTIAL/PLATELET  LIPASE, BLOOD  D-DIMER, QUANTITATIVE (NOT AT [facility])  I-STAT TROPOININ, ED    EKG  EKG Interpretation  Date/Time:  Sunday October 16 2016 12:07:36 EDT Ventricular Rate:  82 PR Interval:  160 QRS Duration: 92 QT Interval:  366 QTC Calculation: 427 R Axis:   64 Text Interpretation:  Normal  sinus rhythm Low voltage QRS Borderline ECG No old tracing to compare No ischemic changes Confirmed by Ellender Hose MD, Lysbeth Galas [phone]) on 10/16/2016 3:26:01 PM       Radiology Dg Chest 2 View  Result Date: 10/16/2016 CLINICAL DATA:  Three day history of chest pain. EXAM: CHEST  2 VIEW COMPARISON:  None. FINDINGS: The heart size and mediastinal contours are within normal limits. Both lungs are clear. The visualized skeletal structures are unremarkable. IMPRESSION: Normal chest x-ray. Electronically Signed   By: Marijo Sanes M.D.   On: 10/16/2016 13:08    Procedures Procedures (including critical care time)  Medications Ordered in ED Medications  gi cocktail (Maalox,Lidocaine,Donnatal) (30 mLs Oral Given 10/16/16 1612)  HYDROmorphone (DILAUDID) injection 1 mg (1 mg Intramuscular Given 10/16/16 1612)     Initial Impression / Assessment and Plan / ED Course  I have reviewed the triage vital signs and the nursing notes.  Pertinent labs & imaging results that were available during my care of the patient were  reviewed by me and considered in my medical decision making (see chart for details).     46 yo F with PMHx above here with mild aching substernal and back pain. Suspect MSK chest wall pain versus GERD/PUD. H/o chronic pain as well. EKG is non-ischemic and trop neg despite constant sx for >12 hours - doubt ACS. D-Dimer neg, doubt PE or dissection. CXR is clear without PNA or PTX. Labs o/w reassuring. Will treat with antacids, outpt GI follow-up. She is already on chronic meds so will avoid narcotics.  Final Clinical Impressions(s) / ED Diagnoses   Final diagnoses:  Epigastric abdominal pain  Atypical chest pain    New Prescriptions Discharge Medication List as of 10/16/2016  6:23 PM    START taking these medications   Details  famotidine (PEPCID) 40 MG tablet Take 1 tablet (40 mg total) by mouth daily., Starting Sun 10/16/2016, Print    sucralfate (CARAFATE) 1 GM/10ML suspension Take 10 mLs (1 g total) by mouth 4 (four) times daily -  with meals and at bedtime., Starting Sun 10/16/2016, Print         Duffy Bruce, MD 10/17/16 [phone]

## 2016-10-16 NOTE — ED Notes (Signed)
Pt is in stable condition upon d/c and ambulates from ED. 

## 2016-10-18 DIAGNOSIS — M542 Cervicalgia: Secondary | ICD-10-CM | POA: Diagnosis not present

## 2016-10-18 DIAGNOSIS — R1013 Epigastric pain: Secondary | ICD-10-CM | POA: Diagnosis not present

## 2016-10-18 DIAGNOSIS — F411 Generalized anxiety disorder: Secondary | ICD-10-CM | POA: Diagnosis not present

## 2016-10-18 DIAGNOSIS — M546 Pain in thoracic spine: Secondary | ICD-10-CM | POA: Diagnosis not present

## 2016-10-28 ENCOUNTER — Encounter: Payer: Self-pay | Admitting: Gastroenterology

## 2016-10-28 ENCOUNTER — Ambulatory Visit (INDEPENDENT_AMBULATORY_CARE_PROVIDER_SITE_OTHER): Payer: BLUE CROSS/BLUE SHIELD | Admitting: Gastroenterology

## 2016-10-28 VITALS — BP 100/60 | HR 78 | Ht 64.0 in | Wt 192.4 lb

## 2016-10-28 DIAGNOSIS — R1012 Left upper quadrant pain: Secondary | ICD-10-CM

## 2016-10-28 DIAGNOSIS — R110 Nausea: Secondary | ICD-10-CM

## 2016-10-28 NOTE — Patient Instructions (Signed)
If you are age 46 or older, your body mass index should be between 23-30. Your Body mass index is 33.03 kg/m. If this is out of the aforementioned range listed, please consider follow up with your Primary Care Provider.  If you are age 69 or younger, your body mass index should be between 19-25. Your Body mass index is 33.03 kg/m. If this is out of the aformentioned range listed, please consider follow up with your Primary Care Provider.   You have been scheduled for an endoscopy. Please follow written instructions given to you at your visit today. If you use inhalers (even only as needed), please bring them with you on the day of your procedure. Your physician has requested that you go to www.startemmi.com and enter the access code given to you at your visit today. This web site gives a general overview about your procedure. However, you should still follow specific instructions given to you by our office regarding your preparation for the procedure.  Thank you for choosing me and [facility].  Alonza Bogus, PA-C

## 2016-10-28 NOTE — Progress Notes (Addendum)
10/28/2016 Nancy Armstrong [id number] July 14, 1970   CC:  LUQ abdominal pain and nausea  HISTORY OF PRESENT ILLNESS:  This is a 46 year old female who was seen by Dr. Carlean Purl and myself previously in 2013 and 2014 for symptoms of IBS/dysphagia and elevated LFT's respectively.  She has not been seen here since that time.  She did end up being evaluated at [facility] for complaints of dysphagia, however.  Evaluation there as follows:  Barium Swallow 07/16/14: Nonspecific esophageal dysmotility including occasional tertiary contractions distally, no esophageal mass or stricture identified, transient delay in passage of the barium tablet at the GEJ.  EGD 08/04/14: Increased spasm and possible hypertonic LES suggesting motility disorder. Esophageal biopsies obtained and were unremarkable (specificially negative for BE and EoE).  Esophageal dilation 08/04/14: 52Fr, 56Fr and 60Fr non-guided bougie dilation performed. No resistance with any of the dilators.  Esophageal manometry (09/08/14): at [facility] showed distal esophageal spasm (possibly consistent with opiate-induced esophageal dysmotility).  She was given levsin to try and was supposed to follow-up but she has not done that.  Anyway, she is here today with complaints of left upper quadrant abdominal pain and nausea.  She was told that she may have an ulcer.  She tells me that all of this began about one month ago.  She said that she was admitted to [facility] for possible SBO.  Does not know or understand details.  Says that she had an NGT and then they said that they did not think she had a SBO so it was removed.  Was in hospital for 3 days.  Says that she had a CT scan.  She was then seen in the ED here at [facility] on 4/8 with complaints of LUQ abdominal pain and nausea at which time she was told that she may have an ulcer.  Was placed on pepcid 40 mg daily and carafate suspension.  Was told to follow-up here.  She is actually feeling a little better.   She is taking those medications as well as ranitidine 150 mg daily.  Has history of endometriosis, migraine headaches, IC, osteoarthritis and is on chronic pain medication.  Past Medical History:  Diagnosis Date  . Hx of endometriosis   . Hx of migraine headaches   . Interstitial cystitis   . Osteoarthritis    neck  . Renal stones    Past Surgical History:  Procedure Laterality Date  . ABDOMINAL HYSTERECTOMY  11/2008   BSO  . CHOLECYSTECTOMY    . ESOPHAGOGASTRODUODENOSCOPY     [name]  . EXPLORATORY LAPAROTOMY     ovarian cyst/endometriosis  . renal stone extraction    . TUBAL LIGATION    . UPJ reconstruction      reports that she has never smoked. She has never used smokeless tobacco. She reports that she drinks alcohol. She reports that she does not use drugs. family history includes Hypertension in her father and mother; Liver cancer in her maternal grandmother; Lung cancer in her maternal grandmother and paternal grandmother. Allergies  Allergen Reactions  . Ciprofloxacin   . Imitrex [Sumatriptan]     Paresthesia of extremities  . Levaquin [Levofloxacin] Other (See Comments)    Shoulder pain  . Relpax [Eletriptan]     Sore throat, swelling   . Topamax [Topiramate]     Edema,sore throat  . Zomig [Zolmitriptan]     Swelling       Outpatient Encounter Prescriptions as of 10/28/2016  Medication Sig  . atorvastatin (  LIPITOR) 10 MG tablet Take 10 mg by mouth daily.  . busPIRone (BUSPAR) 15 MG tablet Take 15 mg by mouth 2 (two) times daily.  . cyclobenzaprine (FLEXERIL) 10 MG tablet Take 10 mg by mouth 3 (three) times daily as needed for muscle spasms.  . famotidine (PEPCID) 40 MG tablet Take 1 tablet (40 mg total) by mouth daily.  . Gabapentin Enacarbil ER (HORIZANT) 300 MG TBCR Take by mouth daily.  . meloxicam (MOBIC) 15 MG tablet Take 15 mg by mouth daily. 1/2 daily  . oxyCODONE-acetaminophen (PERCOCET/ROXICET) 5-325 MG per tablet Take 1 tablet by mouth daily.  .  promethazine (PHENERGAN) 25 MG tablet Take 25 mg by mouth every 6 (six) hours as needed.  . ranitidine (ZANTAC) 150 MG capsule Take 150 mg by mouth every evening.  . rizatriptan (MAXALT) 10 MG tablet Take 10 mg by mouth as needed for migraine. May repeat in 2 hours if needed  . sucralfate (CARAFATE) 1 GM/10ML suspension Take 10 mLs (1 g total) by mouth 4 (four) times daily -  with meals and at bedtime.  . triamterene-hydrochlorothiazide (DYAZIDE) 37.5-25 MG capsule Take 1 capsule by mouth daily.  Marland Kitchen zolpidem (AMBIEN) 5 MG tablet Take 5 mg by mouth at bedtime as needed.  . [DISCONTINUED] butalbital-acetaminophen-caffeine (FIORICET) 50-325-40 MG per tablet Take 1 tablet by mouth every 4 (four) hours as needed.  . [DISCONTINUED] carisoprodol (SOMA) 350 MG tablet Take 350 mg by mouth 2 (two) times daily.  . [DISCONTINUED] cefdinir (OMNICEF) 300 MG capsule Take 300 mg by mouth 2 (two) times daily.  . [DISCONTINUED] Chlorphen-PE-Acetaminophen (NOREL AD PO) Take 1 tablet by mouth 2 (two) times daily.  . [DISCONTINUED] Diclofenac (ZORVOLEX) 35 MG CAPS Take 1 capsule by mouth 3 (three) times daily.  . [DISCONTINUED] glycopyrrolate (ROBINUL) 2 MG tablet Take 1 tablet (2 mg total) by mouth 2 (two) times daily. (Patient not taking: Reported on 10/28/2016)  . [DISCONTINUED] omeprazole (PRILOSEC) 40 MG capsule Take 1 capsule (40 mg total) by mouth daily. (Patient not taking: Reported on 10/28/2016)  . [DISCONTINUED] sertraline (ZOLOFT) 25 MG tablet Take 25 mg by mouth daily.   No facility-administered encounter medications on file as of 10/28/2016.     REVIEW OF SYSTEMS  : All other systems reviewed and negative except where noted in the History of Present Illness.   PHYSICAL EXAM: BP 100/60   Pulse 78   Ht 5\' 4"  (1.626 m)   Wt 192 lb 6.4 oz (87.3 kg)   SpO2 99%   BMI 33.03 kg/m  General: Well developed white female in no acute distress Head: Normocephalic and atraumatic Eyes:  Sclerae anicteric,  conjunctiva pink. Ears: Normal auditory acuity Lungs: Clear throughout to auscultation Heart: Regular rate and rhythm Abdomen: Soft, non-distended.  BS present.  Mild RUQ TTP.   Rectal: Musculoskeletal: Symmetrical with no gross deformities  Skin: No lesions on visible extremities Extremities: No edema  Neurological: Alert oriented x 4, grossly non-focal Psychological:  Alert and cooperative. Normal mood and affect  ASSESSMENT AND PLAN: -46 year old female with complaints of LUQ abdominal pain and nausea:  Symptoms actually improving, but she was told that she may have an ulcer so she is preoccupied with that.  I told her that there is nothing we will do differently than we are already doing if we see one.  Nonetheless, we will schedule for EGD with Dr. Carlean Purl.  I am going to place her back on omeprazole 40 mg daily.  Only needs  to be taking one of the H2 blockers and can use that daily, opposite time of day as the PPI.  Continue carafate suspension for now.    -Dysphagia with distal esophageal spasm (thought to possibly opiate induced).  This was diagnosed at [facility] 2 years ago.  Was given levsin to try and was supposed to follow-up but she has not done that.  *Will obtain records from her recent hospital stay at [facility].  **The risks, benefits, and alternatives to EGD were discussed with the patient and she consenst to proceed.  **Just of note, I also saw after she left that she has also been seen by Dr. [name] in [place] for GI care as well (apparently had an EGD there also).  Unfortunately her visits with him were not addressed and we did not have her sign to get those records.  **Addendum:  Received records from [facility].  It appears that she was admitted with a gastroenteritis and UTI that was complicated by an ileus.  CT scan showed mild to moderate distended small bowel loops with some fecal like material in the mid lower abdomen and right lower abdomen.  Findings  suspicious for ileus, early/partial SBO or distal enteritis.  There was no definite transition point in caliber of small bowel. --has history of several abdominal surgeries.  CC:  CoxElnita Maxwell, MD  Agree with Ms. Meri Pelot's management.  Gatha Mayer, MD, [facility]

## 2016-11-01 ENCOUNTER — Encounter: Payer: Self-pay | Admitting: Internal Medicine

## 2016-11-02 ENCOUNTER — Encounter: Payer: Self-pay | Admitting: Gastroenterology

## 2016-11-02 DIAGNOSIS — R110 Nausea: Secondary | ICD-10-CM

## 2016-11-02 DIAGNOSIS — R1012 Left upper quadrant pain: Principal | ICD-10-CM

## 2016-11-02 MED ORDER — OMEPRAZOLE 40 MG PO CPDR: 40.0000 mg | DELAYED_RELEASE_CAPSULE | Freq: Every day | ORAL | 5 refills | Status: DC

## 2016-11-07 DIAGNOSIS — M47812 Spondylosis without myelopathy or radiculopathy, cervical region: Secondary | ICD-10-CM | POA: Diagnosis not present

## 2016-11-07 DIAGNOSIS — M545 Low back pain: Secondary | ICD-10-CM | POA: Diagnosis not present

## 2016-11-07 DIAGNOSIS — M25552 Pain in left hip: Secondary | ICD-10-CM | POA: Diagnosis not present

## 2016-11-07 DIAGNOSIS — M25551 Pain in right hip: Secondary | ICD-10-CM | POA: Diagnosis not present

## 2016-11-07 DIAGNOSIS — M533 Sacrococcygeal disorders, not elsewhere classified: Secondary | ICD-10-CM | POA: Diagnosis not present

## 2016-11-14 ENCOUNTER — Ambulatory Visit (AMBULATORY_SURGERY_CENTER): Payer: BLUE CROSS/BLUE SHIELD | Admitting: Internal Medicine

## 2016-11-14 VITALS — BP 119/73 | HR 79 | Temp 97.8°F | Resp 14 | Ht 64.0 in | Wt 192.0 lb

## 2016-11-14 DIAGNOSIS — R1012 Left upper quadrant pain: Secondary | ICD-10-CM | POA: Diagnosis not present

## 2016-11-14 MED ORDER — SODIUM CHLORIDE 0.9 % IV SOLN: 500.0000 mL | INTRAVENOUS | Status: DC

## 2016-11-14 NOTE — Op Note (Signed)
[place] Patient Name: Nancy Armstrong Procedure Date: 11/14/2016 10:34 AM MRN: [id number] Endoscopist: Gatha Mayer , MD Age: 46 Referring MD:  Date of Birth: 03-11-1971 Gender: Female Account #: [id number] Procedure:                Upper GI endoscopy Indications:              Abdominal pain in the left upper quadrant Medicines:                Propofol per Anesthesia, Monitored Anesthesia Care Procedure:                Pre-Anesthesia Assessment:                           - Prior to the procedure, a History and Physical                            was performed, and patient medications and                            allergies were reviewed. The patient's tolerance of                            previous anesthesia was also reviewed. The risks                            and benefits of the procedure and the sedation                            options and risks were discussed with the patient.                            All questions were answered, and informed consent                            was obtained. Prior Anticoagulants: The patient has                            taken no previous anticoagulant or antiplatelet                            agents and last took previous NSAID medication 1                            day prior to the procedure. ASA Grade Assessment:                            II - A patient with mild systemic disease. After                            reviewing the risks and benefits, the patient was                            deemed in satisfactory condition to undergo the  procedure.                           After obtaining informed consent, the endoscope was                            passed under direct vision. Throughout the                            procedure, the patient's blood pressure, pulse, and                            oxygen saturations were monitored continuously. The                            Model GIF-HQ190  [id number]) scope was introduced                            through the mouth, and advanced to the second part                            of duodenum. The upper GI endoscopy was                            accomplished without difficulty. The patient                            tolerated the procedure well. Scope In: Scope Out: Findings:                 The mid esophagus and distal esophagus were                            moderately tortuous.                           The exam was otherwise without abnormality.                           The cardia and gastric fundus were normal on                            retroflexion. Complications:            No immediate complications. Estimated Blood Loss:     Estimated blood loss: none. Impression:               - Tortuous esophagus.                           - The examination was otherwise normal.                           - No specimens collected. Recommendation:           - Patient has a contact number available for  emergencies. The signs and symptoms of potential                            delayed complications were discussed with the                            patient. Return to normal activities tomorrow.                            Written discharge instructions were provided to the                            patient.                           - Resume previous diet.                           - Continue present medications.                           - I will review her situation some more - think LUQ                            pain is functional and related to IBS                           She has not responded to duloxetine for arthritis                            pain in past                           Has esophageal dysmotility by manometry study [facility]                            2016 - has failed NTG (headache) and hyoscyamine.                           Will need f/u pending the review - will arrange                             after we call her Gatha Mayer, MD 11/14/2016 11:04:17 AM This report has been signed electronically.

## 2016-11-14 NOTE — Progress Notes (Signed)
Report given to PACU, vss 

## 2016-11-14 NOTE — Progress Notes (Signed)
Pt's states no medical or surgical changes since previsit or office visit. 

## 2016-11-14 NOTE — Patient Instructions (Addendum)
The esophagus is tortuous which is consistent with the malfunction - dysmotility we know about. All else ok.  Could be that the nerves and muscles in the gut are still irritated from the infection you had and making you hurt. I see this slow recovery in IBS patients a lot.  I want to think about your situation some more and call you back about possible treatment options.  I appreciate the opportunity to care for you.  Gatha Mayer, MD, FACG        YOU HAD AN ENDOSCOPIC PROCEDURE TODAY AT [facility]:   Refer to the procedure report that was given to you for any specific questions about what was found during the examination.  If the procedure report does not answer your questions, please call your gastroenterologist to clarify.  If you requested that your care partner not be given the details of your procedure findings, then the procedure report has been included in a sealed envelope for you to review at your convenience later.  YOU SHOULD EXPECT: Some feelings of bloating in the abdomen. Passage of more gas than usual.  Walking can help get rid of the air that was put into your GI tract during the procedure and reduce the bloating. If you had a lower endoscopy (such as a colonoscopy or flexible sigmoidoscopy) you may notice spotting of blood in your stool or on the toilet paper. If you underwent a bowel prep for your procedure, you may not have a normal bowel movement for a few days.  Please Note:  You might notice some irritation and congestion in your nose or some drainage.  This is from the oxygen used during your procedure.  There is no need for concern and it should clear up in a day or so.  SYMPTOMS TO REPORT IMMEDIATELY:     Following upper endoscopy (EGD)  Vomiting of blood or coffee ground material  New chest pain or pain under the shoulder blades  Painful or persistently difficult swallowing  New shortness of breath  Fever of 100F or  higher  Black, tarry-looking stools  For urgent or emergent issues, a gastroenterologist can be reached at any hour by calling [phone].   DIET:  We do recommend a small meal at first, but then you may proceed to your regular diet.  Drink plenty of fluids but you should avoid alcoholic beverages for 24 hours.  ACTIVITY:  You should plan to take it easy for the rest of today and you should NOT DRIVE or use heavy machinery until tomorrow (because of the sedation medicines used during the test).    FOLLOW UP: Our staff will call the number listed on your records the next business day following your procedure to check on you and address any questions or concerns that you may have regarding the information given to you following your procedure. If we do not reach you, we will leave a message.  However, if you are feeling well and you are not experiencing any problems, there is no need to return our call.  We will assume that you have returned to your regular daily activities without incident.  If any biopsies were taken you will be contacted by phone or by letter within the next 1-3 weeks.  Please call us at [phone] if you have not heard about the biopsies in 3 weeks.    SIGNATURES/CONFIDENTIALITY: You and/or your care partner have signed paperwork which will be entered into your  electronic medical record.  These signatures attest to the fact that that the information above on your After Visit Summary has been reviewed and is understood.  Full responsibility of the confidentiality of this discharge information lies with you and/or your care-partner.   Resume medications.

## 2016-11-15 DIAGNOSIS — F411 Generalized anxiety disorder: Secondary | ICD-10-CM | POA: Diagnosis not present

## 2016-11-15 DIAGNOSIS — M25551 Pain in right hip: Secondary | ICD-10-CM | POA: Diagnosis not present

## 2016-11-29 ENCOUNTER — Telehealth: Payer: Self-pay | Admitting: Internal Medicine

## 2016-11-29 NOTE — Telephone Encounter (Signed)
Was there something more we were going to order on her?

## 2016-11-29 NOTE — Telephone Encounter (Signed)
Patient notified to expect Dr. Celesta Aver call

## 2016-11-29 NOTE — Telephone Encounter (Signed)
I will call her by tomorrow

## 2016-12-09 MED ORDER — TRAZODONE HCL 50 MG PO TABS: 25.0000 mg | ORAL_TABLET | Freq: Every day | ORAL | 2 refills | Status: DC

## 2016-12-09 NOTE — Telephone Encounter (Signed)
Please apologize to her as I overlooked calling her back  I would like her to try Trazodone 25 mg qhs # 20 2 RF and see me in about 2 mos please   This medication can relieve her esophageal sxs and help her sleep better.  If not any better at 1 month she should let me know and we can increase the dose.

## 2016-12-09 NOTE — Telephone Encounter (Signed)
Left message for patient to call back  

## 2016-12-09 NOTE — Telephone Encounter (Signed)
Patient notified of the recommendations She is scheduled for follow up in August

## 2016-12-12 ENCOUNTER — Telehealth: Payer: Self-pay | Admitting: Internal Medicine

## 2016-12-12 DIAGNOSIS — M545 Low back pain: Secondary | ICD-10-CM | POA: Diagnosis not present

## 2016-12-12 DIAGNOSIS — M533 Sacrococcygeal disorders, not elsewhere classified: Secondary | ICD-10-CM | POA: Diagnosis not present

## 2016-12-12 DIAGNOSIS — M5090 Cervical disc disorder, unspecified, unspecified cervical region: Secondary | ICD-10-CM | POA: Diagnosis not present

## 2016-12-12 DIAGNOSIS — M25551 Pain in right hip: Secondary | ICD-10-CM | POA: Diagnosis not present

## 2016-12-12 MED ORDER — TRAZODONE HCL 50 MG PO TABS
25.0000 mg | ORAL_TABLET | Freq: Every day | ORAL | 2 refills | Status: DC
Start: 2016-12-12 — End: 2017-01-16

## 2016-12-12 NOTE — Telephone Encounter (Signed)
rx sent

## 2017-01-05 ENCOUNTER — Telehealth: Payer: Self-pay | Admitting: Internal Medicine

## 2017-01-05 DIAGNOSIS — R1012 Left upper quadrant pain: Secondary | ICD-10-CM | POA: Diagnosis not present

## 2017-01-05 DIAGNOSIS — R1032 Left lower quadrant pain: Secondary | ICD-10-CM | POA: Diagnosis not present

## 2017-01-05 NOTE — Telephone Encounter (Signed)
Patient was contacted about her questions.  She reports that she is having similar pain to when she was admitted in the spring.  She says she is in too much pain and is going to go on to the ED.  She thanked me for the call. She may just go to [facility], she asks if they can see the records?  I advised very unlikely unless they have access to care everywhere.

## 2017-01-10 DIAGNOSIS — R109 Unspecified abdominal pain: Secondary | ICD-10-CM | POA: Diagnosis not present

## 2017-01-10 DIAGNOSIS — R1011 Right upper quadrant pain: Secondary | ICD-10-CM | POA: Diagnosis not present

## 2017-01-10 DIAGNOSIS — E876 Hypokalemia: Secondary | ICD-10-CM | POA: Diagnosis not present

## 2017-01-16 ENCOUNTER — Other Ambulatory Visit: Payer: BLUE CROSS/BLUE SHIELD

## 2017-01-16 ENCOUNTER — Encounter: Payer: Self-pay | Admitting: Internal Medicine

## 2017-01-16 ENCOUNTER — Ambulatory Visit (INDEPENDENT_AMBULATORY_CARE_PROVIDER_SITE_OTHER): Payer: BLUE CROSS/BLUE SHIELD | Admitting: Internal Medicine

## 2017-01-16 VITALS — BP 102/76 | HR 68 | Ht 64.0 in | Wt 189.0 lb

## 2017-01-16 DIAGNOSIS — G43109 Migraine with aura, not intractable, without status migrainosus: Secondary | ICD-10-CM

## 2017-01-16 DIAGNOSIS — R110 Nausea: Secondary | ICD-10-CM | POA: Diagnosis not present

## 2017-01-16 DIAGNOSIS — R1012 Left upper quadrant pain: Secondary | ICD-10-CM

## 2017-01-16 DIAGNOSIS — M5090 Cervical disc disorder, unspecified, unspecified cervical region: Secondary | ICD-10-CM | POA: Diagnosis not present

## 2017-01-16 DIAGNOSIS — R3989 Other symptoms and signs involving the genitourinary system: Secondary | ICD-10-CM | POA: Diagnosis not present

## 2017-01-16 DIAGNOSIS — M545 Low back pain: Secondary | ICD-10-CM | POA: Diagnosis not present

## 2017-01-16 DIAGNOSIS — G894 Chronic pain syndrome: Secondary | ICD-10-CM | POA: Diagnosis not present

## 2017-01-16 DIAGNOSIS — M25551 Pain in right hip: Secondary | ICD-10-CM | POA: Diagnosis not present

## 2017-01-16 DIAGNOSIS — M533 Sacrococcygeal disorders, not elsewhere classified: Secondary | ICD-10-CM | POA: Diagnosis not present

## 2017-01-16 MED ORDER — SUCRALFATE 1 GM/10ML PO SUSP: 1.0000 g | Freq: Four times a day (QID) | ORAL | 1 refills | Status: DC | PRN

## 2017-01-16 MED ORDER — TRAZODONE HCL 50 MG PO TABS: 50.0000 mg | ORAL_TABLET | Freq: Every day | ORAL | 1 refills | Status: DC

## 2017-01-16 MED ORDER — RIZATRIPTAN BENZOATE 10 MG PO TABS: 10.0000 mg | ORAL_TABLET | ORAL | 0 refills | Status: DC | PRN

## 2017-01-16 NOTE — Patient Instructions (Addendum)
  We are going to get your records from [facility] for review.   Next time you have a spell try a Maxalt.   We have sent the following medications to your pharmacy for you to pick up at your convenience: [place], trazodone    Your physician has requested that you go to the basement for the following lab work before leaving today: Urine test   Try and quit your Ambien and increase your trazodone to 50mg  at bedtime.  We are giving you information to read on migraine web sites.   Follow up with Dr Carlean Purl in late August.   Normal BMI (Body Mass Index- based on height and weight) is between 19 and 25. Your BMI today is Body mass index is 32.44 kg/m. Marland Kitchen Please consider follow up  regarding your BMI with your Primary Care Provider.  I appreciate the opportunity to care for you. Silvano Rusk, MD, [facility]

## 2017-01-16 NOTE — Progress Notes (Signed)
Nancy Armstrong 46 y.o. 1971-02-04 [id number]  Assessment & Plan:   Encounter Diagnoses  Name Primary?  . LUQ pain Yes  . Nausea without vomiting   . Migraine with aura and without status migrainosus, not intractable   . Abnormal urine color     I'm not certain what is causing this. 2 things I am considering R abdominal migraine syndrome and also porphyria.  Sucralfate and reglan may be taken prn Check for acute intermittent porphyria with Spot urine porphobilinogen, creatinine and porphyrins Try Maxalt if another spell occurs to see if that helps relieve it. Increase trazodone to 50 mg hs and try to quit Ambien, she does have some insomnia, I tricyclic agent can suppress attacks of abdominal pain like this. Aug/Sept appt for follow-up sooner if needed I'm a bit puzzled where the esophageal dysmotility fits in with this. It certainly doesn't explain her abdominal symptoms, I don't know if there is a relationship. She could need referral back to a tertiary center.  I will review the records of the emergency department visits that she had recently. We have requested those.  I appreciate the opportunity to care for this patient. CC: Cox, Kirsten, MD     Subjective:   Chief Complaint:Left upper quadrant pain nausea  HPI The patient is here with persistent symptoms. She's been having intermittent episodic left upper quadrant pain, associated with suprasternal notch pain, and diffuse radiating pain that is quite severe and disabling. It occurs without clear triggers. It is associated with severe nausea but no vomiting. At times she has had diarrhea. EGD was unremarkable recently. I prescribed trazodone because of a history of esophageal dysmotility and the symptoms and she's been taking 25 mg at bedtime for over a month but his continued to have problems. On June 28 in July 3 she went to the emergency department at [facility] blood cell count was 16,000 she tells me but  everything else was negative and it sounds like they repeated a CT scan. Previous CT scan was unrevealing. These episodes of been going on for a year or so at most. She does have a history of irritable bowel syndrome with defecation issues and constipation and lower abdominal pain but says this is different. No medication has helped. Phenergan does help her nausea. Prescribed Reglan by the ER doctors as well as Carafate. She is on a PPI. She does say that after the episodes are urine is somewhat dark-looking. Bloody not tea-colored but darker than normal. There is a remote history of interstitial cystitis but she does not seem to have those problems anymore.  There is a history of migraine headaches with visual aura - successful relief with Maxalt. In the past she saw neurology and apparently had 40 injections of lidocaine or some other substance and do her scalp. She does not want to return to that neurologist. That is remote it seems. Headaches do not seem to be associated with her acute abdominal pain attacks. Sometimes she feels like when she has her abdominal pain she can't move but there are no specific or discrete neurologic complaints that I can discern.  She is on gabapentin for chronic neck pain. She tried metoclopramide it made her very sleepy it was prescribed 3 times a day before meals and at bedtime through the ER. Allergies  Allergen Reactions  . Ciprofloxacin   . Imitrex [Sumatriptan]     Paresthesia of extremities  . Levaquin [Levofloxacin] Other (See Comments)    Shoulder  pain  . Relpax [Eletriptan]     Sore throat, swelling   . Topamax [Topiramate]     Edema,sore throat  . Zomig [Zolmitriptan]     Swelling    Current Meds  Medication Sig  . atorvastatin (LIPITOR) 10 MG tablet Take 10 mg by mouth daily.  . busPIRone (BUSPAR) 15 MG tablet Take 15 mg by mouth 2 (two) times daily.  . cyclobenzaprine (FLEXERIL) 10 MG tablet Take 10 mg by mouth 3 (three) times daily as needed  for muscle spasms.  . famotidine (PEPCID) 40 MG tablet Take 1 tablet (40 mg total) by mouth daily.  . Gabapentin Enacarbil ER (HORIZANT) 300 MG TBCR Take by mouth daily.  . meloxicam (MOBIC) 15 MG tablet Take 15 mg by mouth daily. 1/2 daily  . metoCLOPramide (REGLAN) 10 MG tablet Take 10 mg by mouth 4 (four) times daily -  before meals and at bedtime.  Marland Kitchen oxyCODONE-acetaminophen (PERCOCET/ROXICET) 5-325 MG per tablet Take 1 tablet by mouth daily.  . promethazine (PHENERGAN) 25 MG tablet Take 25 mg by mouth every 6 (six) hours as needed.  . ranitidine (ZANTAC) 150 MG capsule Take 150 mg by mouth every evening.  . rizatriptan (MAXALT) 10 MG tablet Take 10 mg by mouth as needed for migraine. May repeat in 2 hours if needed  . sucralfate (CARAFATE) 1 GM/10ML suspension Take 10 mLs (1 g total) by mouth 4 (four) times daily -  with meals and at bedtime.  . traZODone (DESYREL) 50 MG tablet Take 0.5 tablets (25 mg total) by mouth at bedtime.  . triamterene-hydrochlorothiazide (DYAZIDE) 37.5-25 MG capsule Take 1 capsule by mouth daily.  Marland Kitchen zolpidem (AMBIEN) 5 MG tablet Take 5 mg by mouth at bedtime as needed.   Current Facility-Administered Medications for the 01/16/17 encounter (Office Visit) with Gatha Mayer, MD  Medication  . 0.9 %  sodium chloride infusion   Past Medical History:  Diagnosis Date  . Hx of endometriosis   . Hx of migraine headaches   . Interstitial cystitis   . Osteoarthritis    neck  . Renal stones    Past Surgical History:  Procedure Laterality Date  . ABDOMINAL HYSTERECTOMY  11/2008   BSO  . CHOLECYSTECTOMY    . ESOPHAGOGASTRODUODENOSCOPY     [name]  . EXPLORATORY LAPAROTOMY     ovarian cyst/endometriosis  . renal stone extraction    . TUBAL LIGATION    . UPJ reconstruction     Social History   Social History  . Marital status: Divorced    Spouse name: N/A  . Number of children: 3  . Years of education: N/A   Occupational History  . Clinical cytogeneticist   Social History Main Topics  . Smoking status: Never Smoker  . Smokeless tobacco: Never Used  . Alcohol use 0.0 oz/week     Comment: very rare  . Drug use: No  . Sexual activity: Yes    Partners: Female    Birth control/ protection: Surgical   Other Topics Concern  . Not on file   Social History Narrative   Divorced, 2 sons and 1 daughter      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         family history includes Diverticulosis in her father; Hypertension in her father and mother; Liver cancer in her maternal grandmother; Lung cancer in her maternal grandmother and paternal grandmother.   Review of Systems As per history of present illness  Objective:  Physical Exam BP 102/76   Pulse 68   Ht 5\' 4"  (1.626 m)   Wt 189 lb (85.7 kg)   BMI 32.44 kg/m  NAD Eyes anicteric Lungs CTA Cor S1s2 no rmg abd soft mild LUQ tenderness neuro

## 2017-01-17 LAB — CREATININE, URINE, RANDOM: Creatinine, Urine: 135 mg/dL (ref 20–320)

## 2017-01-18 LAB — PORPHOBILINOGEN, RANDOM URINE: Porphobilinogen, Rand Ur: 1.1 mg/L (ref 0.0–2.0)

## 2017-01-24 LAB — PORPHYRINS, FRACTIONATED, RANDOM URINE
COPROPORPHYRIN I: 14 mcg/g creat (ref 5.6–28.6)
Coproporphyrin III (PORFRU): 16.1 mcg/g creat (ref 4.1–76.4)
HEPTACARBOXYPORPHYRIN: 0.6 mcg/g creat (ref <=2.9)
PENTACARBOXYPORPHYRIN: 1.1 mcg/g creat (ref <=3.5)
TOTAL PORPHYRINS: 43.6 mcg/g creat (ref 23.3–132.4)
Uroporphyrin I: 9.4 mcg/g creat (ref 3.1–18.2)
Uroporphyrin III (PORFRU): 2.4 mcg/g creat (ref <=4.8)

## 2017-01-25 ENCOUNTER — Telehealth: Payer: Self-pay | Admitting: Internal Medicine

## 2017-01-25 DIAGNOSIS — G43909 Migraine, unspecified, not intractable, without status migrainosus: Secondary | ICD-10-CM

## 2017-01-25 MED ORDER — GLYCOPYRROLATE 2 MG PO TABS: ORAL_TABLET | ORAL | 1 refills | Status: DC

## 2017-01-25 MED ORDER — PROCHLORPERAZINE 25 MG RE SUPP
25.0000 mg | Freq: Two times a day (BID) | RECTAL | 1 refills | Status: DC | PRN
Start: 2017-01-25 — End: 2018-04-11

## 2017-01-25 NOTE — Telephone Encounter (Signed)
OK  Sorry to hear  1) Labs I did do not show problems - does not appear to have porphyria 2) Still could be abdominal migraine - cyclic vomiting syndrome even though Maxalt did not eliminate pain  Try the following  1) Prochlorperazine suppository 25 mg every 12 hrs prn for nausea (and may help pain also) # 12 1 RF 2) Glycopyrrolate 2 mg bid- tid prn pain # 60 1 refill 3) Please refer to [facility] re: migraine headaches ? Abdominal migraine please evaluate and treat 4) Keep planned f/u me 5) I have her ED records but haven't looked at them but I will

## 2017-01-25 NOTE — Telephone Encounter (Signed)
Patient reports that she has had abdominal pain for the last 3 days.  The Maxalt "takes the edge off " but still having pain.  "I am really miserable".

## 2017-01-25 NOTE — Telephone Encounter (Signed)
Left message for patient to call back rx sent Referral to neuro placed

## 2017-01-26 ENCOUNTER — Encounter: Payer: Self-pay | Admitting: Neurology

## 2017-01-26 NOTE — Telephone Encounter (Signed)
Patient notified  She is aware that she will be contacted directly by neurology for an appt.

## 2017-02-24 ENCOUNTER — Other Ambulatory Visit: Payer: Self-pay | Admitting: Internal Medicine

## 2017-02-24 NOTE — Telephone Encounter (Signed)
Refill x 3 

## 2017-02-24 NOTE — Telephone Encounter (Signed)
May I refill Sir, has Dr Tomi Likens appointment in October.

## 2017-02-27 DIAGNOSIS — M25551 Pain in right hip: Secondary | ICD-10-CM | POA: Diagnosis not present

## 2017-02-27 DIAGNOSIS — M533 Sacrococcygeal disorders, not elsewhere classified: Secondary | ICD-10-CM | POA: Diagnosis not present

## 2017-02-27 DIAGNOSIS — F1120 Opioid dependence, uncomplicated: Secondary | ICD-10-CM | POA: Diagnosis not present

## 2017-02-27 DIAGNOSIS — M545 Low back pain: Secondary | ICD-10-CM | POA: Diagnosis not present

## 2017-02-28 ENCOUNTER — Ambulatory Visit: Payer: BLUE CROSS/BLUE SHIELD | Admitting: Internal Medicine

## 2017-03-02 ENCOUNTER — Ambulatory Visit: Payer: BLUE CROSS/BLUE SHIELD | Admitting: Internal Medicine

## 2017-03-27 DIAGNOSIS — M25551 Pain in right hip: Secondary | ICD-10-CM | POA: Diagnosis not present

## 2017-03-27 DIAGNOSIS — M545 Low back pain: Secondary | ICD-10-CM | POA: Diagnosis not present

## 2017-03-27 DIAGNOSIS — M533 Sacrococcygeal disorders, not elsewhere classified: Secondary | ICD-10-CM | POA: Diagnosis not present

## 2017-03-27 DIAGNOSIS — G894 Chronic pain syndrome: Secondary | ICD-10-CM | POA: Diagnosis not present

## 2017-04-06 DIAGNOSIS — R51 Headache: Secondary | ICD-10-CM | POA: Diagnosis not present

## 2017-04-06 DIAGNOSIS — G43909 Migraine, unspecified, not intractable, without status migrainosus: Secondary | ICD-10-CM | POA: Diagnosis not present

## 2017-04-10 DIAGNOSIS — I808 Phlebitis and thrombophlebitis of other sites: Secondary | ICD-10-CM | POA: Diagnosis not present

## 2017-04-25 DIAGNOSIS — M25551 Pain in right hip: Secondary | ICD-10-CM | POA: Diagnosis not present

## 2017-04-25 DIAGNOSIS — F1120 Opioid dependence, uncomplicated: Secondary | ICD-10-CM | POA: Diagnosis not present

## 2017-04-25 DIAGNOSIS — M545 Low back pain: Secondary | ICD-10-CM | POA: Diagnosis not present

## 2017-04-25 DIAGNOSIS — G894 Chronic pain syndrome: Secondary | ICD-10-CM | POA: Diagnosis not present

## 2017-04-25 DIAGNOSIS — M533 Sacrococcygeal disorders, not elsewhere classified: Secondary | ICD-10-CM | POA: Diagnosis not present

## 2017-05-08 ENCOUNTER — Encounter: Payer: Self-pay | Admitting: Neurology

## 2017-05-08 ENCOUNTER — Ambulatory Visit (INDEPENDENT_AMBULATORY_CARE_PROVIDER_SITE_OTHER): Payer: BLUE CROSS/BLUE SHIELD | Admitting: Neurology

## 2017-05-08 VITALS — BP 108/76 | HR 88 | Ht 64.0 in | Wt 190.0 lb

## 2017-05-08 DIAGNOSIS — G43709 Chronic migraine without aura, not intractable, without status migrainosus: Secondary | ICD-10-CM | POA: Diagnosis not present

## 2017-05-08 DIAGNOSIS — IMO0002 Reserved for concepts with insufficient information to code with codable children: Secondary | ICD-10-CM

## 2017-05-08 MED ORDER — DIHYDROERGOTAMINE MESYLATE 4 MG/ML NA SOLN
1.0000 | NASAL | 3 refills | Status: DC | PRN
Start: 2017-05-08 — End: 2018-04-11

## 2017-05-08 NOTE — Patient Instructions (Signed)
Migraine Recommendations: 1.  We will set you up for Botox 2.  Take Migranal at earliest onset of migraine.  Spray once in each nostril.  You may repeat dose in 15 minutes x 2 if needed. 3.  Limit use of pain relievers to no more than 2 days out of the week.  These medications include acetaminophen, ibuprofen, triptans and narcotics.  This will help reduce risk of rebound headaches. 4.  Be aware of common food triggers such as processed sweets, processed foods with nitrites (such as deli meat, hot dogs, sausages), foods with MSG, alcohol (such as wine), chocolate, certain cheeses, certain fruits (dried fruits, bananas, some citrus fruit), vinegar, diet soda. 4.  Avoid caffeine 5.  Routine exercise 6.  Proper sleep hygiene 7.  Stay adequately hydrated with water 8.  Keep a headache diary. 9.  Maintain proper stress management. 10.  Do not skip meals. 11.  Consider supplements:  Magnesium citrate 400mg  to 600mg  daily, riboflavin 400mg , Coenzyme Q 10 100mg  three times daily 12.  Follow up for Botox  Migraine Headache A migraine headache is an intense, throbbing pain on one side or both sides of the head. Migraines may also cause other symptoms, such as nausea, vomiting, and sensitivity to light and noise. What are the causes? Doing or taking certain things may also trigger migraines, such as:  Alcohol.  Smoking.  Medicines, such as: ? Medicine used to treat chest pain (nitroglycerine). ? Birth control pills. ? Estrogen pills. ? Certain blood pressure medicines.  Aged cheeses, chocolate, or caffeine.  Foods or drinks that contain nitrates, glutamate, aspartame, or tyramine.  Physical activity.  Other things that may trigger a migraine include:  Menstruation.  Pregnancy.  Hunger.  Stress, lack of sleep, too much sleep, or fatigue.  Weather changes.  What increases the risk? The following factors may make you more likely to experience migraine headaches:  Age. Risk  increases with age.  Family history of migraine headaches.  Being Caucasian.  Depression and anxiety.  Obesity.  Being a woman.  Having a hole in the heart (patent foramen ovale) or other heart problems.  What are the signs or symptoms? The main symptom of this condition is pulsating or throbbing pain. Pain may:  Happen in any area of the head, such as on one side or both sides.  Interfere with daily activities.  Get worse with physical activity.  Get worse with exposure to bright lights or loud noises.  Other symptoms may include:  Nausea.  Vomiting.  Dizziness.  General sensitivity to bright lights, loud noises, or smells.  Before you get a migraine, you may get warning signs that a migraine is developing (aura). An aura may include:  Seeing flashing lights or having blind spots.  Seeing bright spots, halos, or zigzag lines.  Having tunnel vision or blurred vision.  Having numbness or a tingling feeling.  Having trouble talking.  Having muscle weakness.  How is this diagnosed? A migraine headache can be diagnosed based on:  Your symptoms.  A physical exam.  Tests, such as CT scan or MRI of the head. These imaging tests can help rule out other causes of headaches.  Taking fluid from the spine (lumbar puncture) and analyzing it (cerebrospinal fluid analysis, or CSF analysis).  How is this treated? A migraine headache is usually treated with medicines that:  Relieve pain.  Relieve nausea.  Prevent migraines from coming back.  Treatment may also include:  Acupuncture.  Lifestyle changes like avoiding  foods that trigger migraines.  Follow these instructions at home: Medicines  Take over-the-counter and prescription medicines only as told by your health care provider.  Do not drive or use heavy machinery while taking prescription pain medicine.  To prevent or treat constipation while you are taking prescription pain medicine, your health  care provider may recommend that you: ? Drink enough fluid to keep your urine clear or pale yellow. ? Take over-the-counter or prescription medicines. ? Eat foods that are high in fiber, such as fresh fruits and vegetables, whole grains, and beans. ? Limit foods that are high in fat and processed sugars, such as fried and sweet foods. Lifestyle  Avoid alcohol use.  Do not use any products that contain nicotine or tobacco, such as cigarettes and e-cigarettes. If you need help quitting, ask your health care provider.  Get at least 8 hours of sleep every night.  Limit your stress. General instructions   Keep a journal to find out what may trigger your migraine headaches. For example, write down: ? What you eat and drink. ? How much sleep you get. ? Any change to your diet or medicines.  If you have a migraine: ? Avoid things that make your symptoms worse, such as bright lights. ? It may help to lie down in a dark, quiet room. ? Do not drive or use heavy machinery. ? Ask your health care provider what activities are safe for you while you are experiencing symptoms.  Keep all follow-up visits as told by your health care provider. This is important. Contact a health care provider if:  You develop symptoms that are different or more severe than your usual migraine symptoms. Get help right away if:  Your migraine becomes severe.  You have a fever.  You have a stiff neck.  You have vision loss.  Your muscles feel weak or like you cannot control them.  You start to lose your balance often.  You develop trouble walking.  You faint. This information is not intended to replace advice given to you by your health care provider. Make sure you discuss any questions you have with your health care provider. Document Released: 06/27/2005 Document Revised: 01/15/2016 Document Reviewed: 12/14/2015 Elsevier Interactive Patient Education  2017 [employer].

## 2017-05-08 NOTE — Progress Notes (Signed)
NEUROLOGY CONSULTATION NOTE  MIESHIA PEPITONE MRN: [id number] DOB: 31-Jul-1970  Referring provider: Dr. Carlean Purl Primary care provider: Dr. Tobie Poet  Reason for consult:  migraine  HISTORY OF PRESENT ILLNESS: ALEIYAH HALPIN is a 46 year old right-handed female with fibromyalgia who presents for migraine.  History supplemented by GI note.  Onset:  46 years old Location:  Left sided (starts above left eye and spreads) Quality:  Stabbing above left eye/left sided pressure and aching Intensity:  7/10 Aura:  Sometimes preceded by squiggly lines or scotoma for 10-15 minutes Prodrome:  fatigue Postdrome:  fatigue Associated symptoms:  Nausea, photophobia, phonophobia.  She has not had any new worse headache of her life, waking up from sleep Duration:  3 to 4 hours Frequency:  Varies.  It occurs 3 days a week for several weeks to months and may be headache-free for several weeks.  She has had them 16 days a month for the past 3 months. Frequency of abortive medication: daily Triggers/exacerbating factors:  unknown Relieving factors:  Heating pad Activity:  Cannot function 4 days a month.  Past NSAIDS:  ibuprofen, diclofenac Past analgesics:  Tylenol, Excedrin Past abortive triptans:  Relpax (sore throat/swelling), sumatriptan (paresthesia), Zomig (swelling) Past muscle relaxants:  no Past anti-emetic:  Zofran Past antihypertensive medications:  Yes (unsure which one) Past antidepressant medications:  Sertraline, amitriptyline, Effexor Past anticonvulsant medications:  topiramate (edema, sore throat) Past vitamins/Herbal/Supplements:  no Other past therapies:  Trigger point injections (painful)  Current NSAIDS:  Mobic Current analgesics:  Percocet Current triptans:  Maxalt Current anti-emetic:  Reglan, promethazine 25mg  (effective but causes drowsiness), Compazine 25mg  PR (has not tried yet) Current muscle relaxants:  Flexeril Current anti-anxiolytic:  Buspirone Current sleep aide:   Ambien Current Antihypertensive medications:  Dyazide Current Antidepressant medications:  no Current Anticonvulsant medications:  Horizant 300mg  Current Vitamins/Herbal/Supplements:  B6, E56, folic acid, magnesium 400mg  Current Antihistamines/Decongestants:  no Other therapy:  Coke for abortive therapy  Caffeine:  1 tea, 1 soda Alcohol:  Once a month Smoker:  no Diet:  Does not hydrate enough.  No specific diet.  Drinks soda Exercise:  Walks in the evening Depression/anxiety:  okay Sleep hygiene:  poor Family history of headache:  Mother, sisters, brother  She has osteoarthritis in neck and may be receiving epidural or foraminal injections.  Since the summer, she has been experiencing episodes of left upper quadrant pain that radiates diffusely, associated with burning sensation in chest, diaphoresis, nausea and lightheadedness but no vomiting.  There is no associated headache.  It occurs during periods when she is not currently experiencing migraines.  Maxalt helps ease the pain and intensity.  GI workup is negative.  There is question of abdominal migraines.  10/16/16 CMP with Na 139, K 3.1, Cl 103, CO2 28, glucose 95, BUN 10, Cr 0.88, t. bili 0.3, ALP 102, AST 17 and ALT 16.  PAST MEDICAL HISTORY: Past Medical History:  Diagnosis Date  . Hx of endometriosis   . Hx of migraine headaches   . Interstitial cystitis   . Osteoarthritis    neck  . Renal stones     PAST SURGICAL HISTORY: Past Surgical History:  Procedure Laterality Date  . ABDOMINAL HYSTERECTOMY  11/2008   BSO  . CHOLECYSTECTOMY    . ESOPHAGOGASTRODUODENOSCOPY     [name]  . EXPLORATORY LAPAROTOMY     ovarian cyst/endometriosis  . renal stone extraction    . TUBAL LIGATION    . UPJ reconstruction  MEDICATIONS: Current Outpatient Prescriptions on File Prior to Visit  Medication Sig Dispense Refill  . atorvastatin (LIPITOR) 10 MG tablet Take 10 mg by mouth daily.    . busPIRone (BUSPAR) 15 MG tablet Take  15 mg by mouth 2 (two) times daily.    . cyclobenzaprine (FLEXERIL) 10 MG tablet Take 10 mg by mouth 3 (three) times daily as needed for muscle spasms.    . famotidine (PEPCID) 40 MG tablet Take 1 tablet (40 mg total) by mouth daily. 14 tablet 0  . Gabapentin Enacarbil ER (HORIZANT) 300 MG TBCR Take by mouth daily.    Marland Kitchen glycopyrrolate (ROBINUL) 2 MG tablet Take 1 tablet by mouth 2-3 times a day as needed for pain 60 tablet 1  . meloxicam (MOBIC) 15 MG tablet Take 15 mg by mouth daily. 1/2 daily    . metoCLOPramide (REGLAN) 10 MG tablet Take 10 mg by mouth every 8 (eight) hours as needed.     Marland Kitchen oxyCODONE-acetaminophen (PERCOCET/ROXICET) 5-325 MG per tablet Take 1 tablet by mouth daily.    . prochlorperazine (COMPAZINE) 25 MG suppository Place 1 suppository (25 mg total) rectally every 12 (twelve) hours as needed for nausea or vomiting. 12 suppository 1  . promethazine (PHENERGAN) 25 MG tablet Take 25 mg by mouth every 6 (six) hours as needed.    . ranitidine (ZANTAC) 150 MG capsule Take 150 mg by mouth every evening.    . rizatriptan (MAXALT) 10 MG tablet TAKE 1 TABLET (10 MG TOTAL) BY MOUTH AS NEEDED FOR MIGRAINE. MAY REPEAT IN 2 HOURS IF NEEDED 8 tablet 2  . sucralfate (CARAFATE) 1 GM/10ML suspension Take 10 mLs (1 g total) by mouth 4 (four) times daily as needed. 420 mL 1  . traZODone (DESYREL) 50 MG tablet Take 1 tablet (50 mg total) by mouth at bedtime. 90 tablet 1  . triamterene-hydrochlorothiazide (DYAZIDE) 37.5-25 MG capsule Take 1 capsule by mouth daily.    Marland Kitchen zolpidem (AMBIEN) 5 MG tablet Take 5 mg by mouth at bedtime as needed.     No current facility-administered medications on file prior to visit.     ALLERGIES: Allergies  Allergen Reactions  . Ciprofloxacin   . Imitrex [Sumatriptan]     Paresthesia of extremities  . Levaquin [Levofloxacin] Other (See Comments)    Shoulder pain  . Relpax [Eletriptan]     Sore throat, swelling   . Topamax [Topiramate]     Edema,sore throat   . Zomig [Zolmitriptan]     Swelling     FAMILY HISTORY: Family History  Problem Relation Age of Onset  . Hypertension Mother   . COPD Mother   . Lung cancer Paternal Grandmother   . Liver cancer Maternal Grandmother   . Lung cancer Maternal Grandmother   . Hypertension Father   . Diverticulosis Father   . Diabetes Father   . Parkinson's disease Father   . Colon cancer Neg Hx     SOCIAL HISTORY: Social History   Social History  . Marital status: Divorced    Spouse name: N/A  . Number of children: 3  . Years of education: some college   Occupational History  . Technical sales engineer   Social History Main Topics  . Smoking status: Never Smoker  . Smokeless tobacco: Never Used  . Alcohol use 0.0 oz/week     Comment: very rare  . Drug use: No  . Sexual activity: Yes    Partners: Female    Birth  control/ protection: Surgical   Other Topics Concern  . Not on file   Social History Narrative   Divorced, 2 sons and 1 daughter      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          REVIEW OF SYSTEMS: Constitutional: No fevers, chills, or sweats, no generalized fatigue, change in appetite Eyes: No visual changes, double vision, eye pain Ear, nose and throat: No hearing loss, ear pain, nasal congestion, sore throat Cardiovascular: No chest pain, palpitations Respiratory:  No shortness of breath at rest or with exertion, wheezes GastrointestinaI: No nausea, vomiting, diarrhea, abdominal pain, fecal incontinence Genitourinary:  No dysuria, urinary retention or frequency Musculoskeletal:  No neck pain, back pain Integumentary: No rash, pruritus, skin lesions Neurological: as above Psychiatric: No depression, insomnia, anxiety Endocrine: No palpitations, fatigue, diaphoresis, mood swings, change in appetite, change in weight, increased thirst Hematologic/Lymphatic:  No purpura, petechiae. Allergic/Immunologic: no itchy/runny eyes, nasal congestion, recent allergic reactions,  rashes  PHYSICAL EXAM: Vitals:   05/08/17 0801  BP: 108/76  Pulse: 88  SpO2: 98%   General: No acute distress.  Patient appears well-groomed.  Head:  Normocephalic/atraumatic Eyes:  fundi examined but not visualized Neck: supple, paraspinal tenderness, full range of motion Back: Paraspinal tenderness Heart: regular rate and rhythm Lungs: Clear to auscultation bilaterally. Vascular: No carotid bruits. Neurological Exam: Mental status: alert and oriented to person, place, and time, recent and remote memory intact, fund of knowledge intact, attention and concentration intact, speech fluent and not dysarthric, language intact. Cranial nerves: CN I: not tested CN II: pupils equal, round and reactive to light, visual fields intact CN III, IV, VI:  full range of motion, no nystagmus, no ptosis CN V: facial sensation intact CN VII: upper and lower face symmetric CN VIII: hearing intact CN IX, X: gag intact, uvula midline CN XI: sternocleidomastoid and trapezius muscles intact CN XII: tongue midline Bulk & Tone: normal, no fasciculations. Motor:  5/5 throughout  Sensation: temperature and vibration sensation intact. Deep Tendon Reflexes:  2+ throughout, toes downgoing.  Finger to nose testing:  Without dysmetria.  Heel to shin:  Without dysmetria.  Gait:  Normal station and stride.  Able to turn and tandem walk. Romberg negative.  IMPRESSION: 1.  Chronic migraine, sometimes with visual aura 2.  Episodes of nausea and abdominal pain.  I am not convinced that these are abdominal migraines or migraine-related.   Abdominal migraines almost exclusively occurs in children.  Also, these events occur when she is not experiencing periods of increased migraine frequency.  She says that Maxalt helps ease (but not abort) the abdominal symptoms.  However triptans are used to abort headache (not associated symptoms), so it could be a placebo effect.  Regardless, we will be treating her migraines  anyway.  PLAN: 1.  She has had 16 headache days a month for the past 3 months.  She has failed several preventative medications (blood pressure medication, topiramate, amitriptyline, Effexor).  She is a candidate for Botox. 2.  For abortive migraine medication, will try Migranal 3.  Lifestyle modification:  Diet, exercise, hydration, sleep hygiene 4.  Discussed supplements (in addition to Mg try riboflavin and CoQ10) 5.  Follow up for Botox  Thank you for allowing me to take part in the care of this patient.  Metta Clines, DO  CC:  Silvano Rusk, MD  Rochel Brome, MD

## 2017-05-18 ENCOUNTER — Telehealth: Payer: Self-pay

## 2017-05-18 NOTE — Telephone Encounter (Signed)
Called [employer] (05/17/17) spoke with Rockwell [place]. To start botox PA. Pt has an active policy. Ref#'s are: [phone] Fax clinicals to  [phone]

## 2017-05-19 NOTE — Telephone Encounter (Signed)
Rcvd approval for Botox-valid for 2 visits 05/17/17-11/13/17 [phone]

## 2017-05-24 DIAGNOSIS — M47812 Spondylosis without myelopathy or radiculopathy, cervical region: Secondary | ICD-10-CM | POA: Diagnosis not present

## 2017-05-26 ENCOUNTER — Ambulatory Visit (INDEPENDENT_AMBULATORY_CARE_PROVIDER_SITE_OTHER): Payer: BLUE CROSS/BLUE SHIELD | Admitting: Neurology

## 2017-05-26 DIAGNOSIS — IMO0002 Reserved for concepts with insufficient information to code with codable children: Secondary | ICD-10-CM

## 2017-05-26 DIAGNOSIS — G43709 Chronic migraine without aura, not intractable, without status migrainosus: Secondary | ICD-10-CM | POA: Diagnosis not present

## 2017-05-26 MED ORDER — ONABOTULINUMTOXINA 100 UNITS IJ SOLR
155.0000 [IU] | Freq: Once | INTRAMUSCULAR | Status: AC
  Administered 2017-05-26: 155 [IU] via INTRAMUSCULAR

## 2017-05-26 NOTE — Progress Notes (Signed)
[facility]   Procedure Note Botox  Attending: Dr. Adam Jaffe  Preoperative Diagnosis(es): Chronic migraine  Consent obtained from: The patient Benefits discussed included, but were not limited to decreased muscle tightness, increased joint range of motion, and decreased pain.  Risk discussed included, but were not limited pain and discomfort, bleeding, bruising, excessive weakness, venous thrombosis, muscle atrophy and dysphagia.  Anticipated outcomes of the procedure as well as he risks and benefits of the alternatives to the procedure, and the roles and tasks of the personnel to be involved, were discussed with the patient, and the patient consents to the procedure and agrees to proceed. A copy of the patient medication guide was given to the patient which explains the blackbox warning.  Patients identity and treatment sites confirmed Yes.  .  Details of Procedure: Skin was cleaned with alcohol. Prior to injection, the needle plunger was aspirated to make sure the needle was not within a blood vessel.  There was no blood retrieved on aspiration.    Following is a summary of the muscles injected  And the amount of Botulinum toxin used:  Dilution 200 units of Botox was reconstituted with 4 ml of preservative free normal saline. Time of reconstitution: At the time of the office visit (<30 minutes prior to injection)   Injections  155 total units of Botox was injected with a 30 gauge needle.  Injection Sites: L occipitalis: 15 units- 3 sites  R occiptalis: 15 units- 3 sites  L upper trapezius: 15 units- 3 sites R upper trapezius: 15 units- 3 sits          L paraspinal: 10 units- 2 sites R paraspinal: 10 units- 2 sites  Face L frontalis(2 injection sites):10 units   R frontalis(2 injection sites):10 units         L corrugator: 5 units   R corrugator: 5 units           Procerus: 5 units   L temporalis: 20 units R temporalis: 20 units   Agent:  200 units of botulinum Type  A (Onobotulinum Toxin type A) was reconstituted with 4 ml of preservative free normal saline.  Time of reconstitution: At the time of the office visit (<30 minutes prior to injection)     Total injected (Units): 155  Total wasted (Units): 45  Patient tolerated procedure well without complications.   Reinjection is anticipated in 3 months. Return to clinic in 4.5 months.  

## 2017-05-29 ENCOUNTER — Encounter: Payer: Self-pay | Admitting: Neurology

## 2017-05-29 DIAGNOSIS — M25551 Pain in right hip: Secondary | ICD-10-CM | POA: Diagnosis not present

## 2017-05-29 DIAGNOSIS — M533 Sacrococcygeal disorders, not elsewhere classified: Secondary | ICD-10-CM | POA: Diagnosis not present

## 2017-05-29 DIAGNOSIS — G894 Chronic pain syndrome: Secondary | ICD-10-CM | POA: Diagnosis not present

## 2017-05-29 DIAGNOSIS — M545 Low back pain: Secondary | ICD-10-CM | POA: Diagnosis not present

## 2017-05-29 DIAGNOSIS — F1120 Opioid dependence, uncomplicated: Secondary | ICD-10-CM | POA: Diagnosis not present

## 2017-06-26 DIAGNOSIS — M5481 Occipital neuralgia: Secondary | ICD-10-CM | POA: Diagnosis not present

## 2017-06-26 DIAGNOSIS — M545 Low back pain: Secondary | ICD-10-CM | POA: Diagnosis not present

## 2017-06-26 DIAGNOSIS — M533 Sacrococcygeal disorders, not elsewhere classified: Secondary | ICD-10-CM | POA: Diagnosis not present

## 2017-06-26 DIAGNOSIS — M25551 Pain in right hip: Secondary | ICD-10-CM | POA: Diagnosis not present

## 2017-07-12 DIAGNOSIS — M5481 Occipital neuralgia: Secondary | ICD-10-CM | POA: Diagnosis not present

## 2017-07-25 DIAGNOSIS — M545 Low back pain: Secondary | ICD-10-CM | POA: Diagnosis not present

## 2017-07-25 DIAGNOSIS — G894 Chronic pain syndrome: Secondary | ICD-10-CM | POA: Diagnosis not present

## 2017-07-25 DIAGNOSIS — M533 Sacrococcygeal disorders, not elsewhere classified: Secondary | ICD-10-CM | POA: Diagnosis not present

## 2017-07-25 DIAGNOSIS — M5481 Occipital neuralgia: Secondary | ICD-10-CM | POA: Diagnosis not present

## 2017-07-25 DIAGNOSIS — M25551 Pain in right hip: Secondary | ICD-10-CM | POA: Diagnosis not present

## 2017-08-07 DIAGNOSIS — J0100 Acute maxillary sinusitis, unspecified: Secondary | ICD-10-CM | POA: Diagnosis not present

## 2017-08-15 DIAGNOSIS — J0100 Acute maxillary sinusitis, unspecified: Secondary | ICD-10-CM | POA: Diagnosis not present

## 2017-08-15 DIAGNOSIS — J209 Acute bronchitis, unspecified: Secondary | ICD-10-CM | POA: Diagnosis not present

## 2017-08-22 DIAGNOSIS — M5481 Occipital neuralgia: Secondary | ICD-10-CM | POA: Diagnosis not present

## 2017-08-22 DIAGNOSIS — M25551 Pain in right hip: Secondary | ICD-10-CM | POA: Diagnosis not present

## 2017-08-22 DIAGNOSIS — M533 Sacrococcygeal disorders, not elsewhere classified: Secondary | ICD-10-CM | POA: Diagnosis not present

## 2017-08-22 DIAGNOSIS — M545 Low back pain: Secondary | ICD-10-CM | POA: Diagnosis not present

## 2017-09-01 ENCOUNTER — Ambulatory Visit: Payer: BLUE CROSS/BLUE SHIELD | Admitting: Neurology

## 2017-10-03 ENCOUNTER — Ambulatory Visit: Payer: BLUE CROSS/BLUE SHIELD | Admitting: Neurology

## 2017-11-09 DIAGNOSIS — M25561 Pain in right knee: Secondary | ICD-10-CM | POA: Diagnosis not present

## 2017-11-15 ENCOUNTER — Ambulatory Visit (INDEPENDENT_AMBULATORY_CARE_PROVIDER_SITE_OTHER): Payer: BLUE CROSS/BLUE SHIELD | Admitting: Orthopedic Surgery

## 2017-11-15 DIAGNOSIS — M25561 Pain in right knee: Secondary | ICD-10-CM | POA: Diagnosis not present

## 2017-11-15 DIAGNOSIS — M5481 Occipital neuralgia: Secondary | ICD-10-CM | POA: Diagnosis not present

## 2017-11-15 DIAGNOSIS — M25551 Pain in right hip: Secondary | ICD-10-CM | POA: Diagnosis not present

## 2017-11-15 DIAGNOSIS — M533 Sacrococcygeal disorders, not elsewhere classified: Secondary | ICD-10-CM | POA: Diagnosis not present

## 2017-11-15 DIAGNOSIS — M545 Low back pain: Secondary | ICD-10-CM | POA: Diagnosis not present

## 2017-11-19 ENCOUNTER — Encounter (INDEPENDENT_AMBULATORY_CARE_PROVIDER_SITE_OTHER): Payer: Self-pay | Admitting: Orthopedic Surgery

## 2017-11-19 NOTE — Progress Notes (Signed)
Office Visit Note   Patient: Nancy Armstrong           Date of Birth: February 14, 1971           MRN: [id number] Visit Date: 11/15/2017 Requested by: Rochel Brome, MD [address] [place], [place] [address] PCP: Rochel Brome, MD  Subjective: Chief Complaint  Patient presents with  . Right Knee - Pain    HPI: Nancy Armstrong is a patient with 1 month history of right knee pain.  She actually fell down the stairs about a month ago.  Landed on the anterior aspect of her knee.  Radiographs performed outside were intact.  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  Denied any swelling at the time.  She is on Mobic and Flexeril for neck arthritis.  She feels at times that the right knee and leg want to buckle.              ROS: All systems reviewed are negative as they relate to the chief complaint within the history of present illness.  Patient denies  fevers or chills.   Assessment & Plan: Visit Diagnoses:  1. Acute pain of right knee     Plan: Impression is right knee pain primarily anterior following impact injury.  Extensor mechanism is intact today.  Patient has a brace.  I would like to try to reset the inflammation in her knee with an intra-articular injection.  4-week return to decide for or against further imaging such as MRI scanning to look for mechanical pathology.  Follow-Up Instructions: Return in about 1 month (around 12/13/2017).   Orders:  No orders of the defined types were placed in this encounter.  No orders of the defined types were placed in this encounter.     Procedures: No procedures performed   Clinical Data: No additional findings.  Objective: Vital Signs: There were no vitals taken for this visit.  Physical Exam:   Constitutional: Patient appears well-developed HEENT:  Head: Normocephalic Eyes:EOM are normal Neck: Normal range of motion Cardiovascular: Normal rate Pulmonary/chest: Effort normal Neurologic: Patient is  alert Skin: Skin is warm Psychiatric: Patient has normal mood and affect    Ortho Exam: Orthopedic exam demonstrates normal gait alignment.  Full range of motion of the right knee is present with no effusion.  Patient does have fat pad type tenderness but intact extensor mechanism.  Collateral and cruciate ligaments are stable.  No masses lymphadenopathy or skin changes noted in that right knee region.  Specialty Comments:  No specialty comments available.  Imaging: No results found.   PMFS History: Patient Active Problem List   Diagnosis Date Noted  . LUQ abdominal pain 11/02/2016  . Nausea 11/02/2016  . Elevated LFTs 10/29/2012  . Abdominal pain, epigastric 10/29/2012  . Atypical chest pain 10/29/2012  . IBS (irritable bowel syndrome) 04/27/2012  . Esophageal pain 04/27/2012   Past Medical History:  Diagnosis Date  . Hx of endometriosis   . Hx of migraine headaches   . Interstitial cystitis   . Osteoarthritis    neck  . Renal stones     Family History  Problem Relation Age of Onset  . Hypertension Mother   . COPD Mother   . Lung cancer Paternal Grandmother   . Liver cancer Maternal Grandmother   . Lung cancer Maternal Grandmother   . Hypertension Father   . Diverticulosis Father   . Diabetes Father   . Parkinson's disease  Father   . Colon cancer Neg Hx     Past Surgical History:  Procedure Laterality Date  . ABDOMINAL HYSTERECTOMY  11/2008   BSO  . CHOLECYSTECTOMY    . ESOPHAGOGASTRODUODENOSCOPY     [name]  . EXPLORATORY LAPAROTOMY     ovarian cyst/endometriosis  . renal stone extraction    . TUBAL LIGATION    . UPJ reconstruction     Social History   Occupational History  . Occupation: Scientist, physiological    Comment: Auditor  Tobacco Use  . Smoking status: Never Smoker  . Smokeless tobacco: Never Used  Substance and Sexual Activity  . Alcohol use: Yes    Alcohol/week: 0.0 oz    Comment: very rare  . Drug use: No  . Sexual activity: Yes     Partners: Female    Birth control/protection: Surgical

## 2017-12-08 DIAGNOSIS — J020 Streptococcal pharyngitis: Secondary | ICD-10-CM | POA: Diagnosis not present

## 2017-12-12 DIAGNOSIS — Z79891 Long term (current) use of opiate analgesic: Secondary | ICD-10-CM | POA: Diagnosis not present

## 2017-12-12 DIAGNOSIS — M5481 Occipital neuralgia: Secondary | ICD-10-CM | POA: Diagnosis not present

## 2017-12-12 DIAGNOSIS — M25551 Pain in right hip: Secondary | ICD-10-CM | POA: Diagnosis not present

## 2017-12-12 DIAGNOSIS — G894 Chronic pain syndrome: Secondary | ICD-10-CM | POA: Diagnosis not present

## 2017-12-14 ENCOUNTER — Ambulatory Visit (INDEPENDENT_AMBULATORY_CARE_PROVIDER_SITE_OTHER): Payer: BLUE CROSS/BLUE SHIELD | Admitting: Orthopedic Surgery

## 2017-12-18 ENCOUNTER — Ambulatory Visit (INDEPENDENT_AMBULATORY_CARE_PROVIDER_SITE_OTHER): Payer: BLUE CROSS/BLUE SHIELD | Admitting: Orthopedic Surgery

## 2017-12-18 ENCOUNTER — Encounter (INDEPENDENT_AMBULATORY_CARE_PROVIDER_SITE_OTHER): Payer: Self-pay | Admitting: Orthopedic Surgery

## 2017-12-18 DIAGNOSIS — M25561 Pain in right knee: Secondary | ICD-10-CM

## 2017-12-18 NOTE — Progress Notes (Signed)
Office Visit Note   Patient: Nancy Armstrong           Date of Birth: [birth year]           MRN: [id number] Visit Date: 12/18/2017 Requested by: Rochel Brome, MD [address] [place], [place] [address] PCP: Rochel Brome, MD  Subjective: Chief Complaint  Patient presents with  . Right Knee - Pain, Follow-up    HPI: Nancy Armstrong is a patient with right knee pain.  She had right knee injection on 11/15/2017 with no significant relief.  She fell down the stairs in April.  She is having mechanical symptoms with weakness swelling stiffness catching and locking.  The pain will occasionally radiate to her ankle.  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  Denies much in the way of back pain or numbness and tingling.  She has been taking Flexeril oxycodone and Mobic for the problem.              ROS: All systems reviewed are negative as they relate to the chief complaint within the history of present illness.  Patient denies  fevers or chills.   Assessment & Plan: Visit Diagnoses:  1. Right knee pain, unspecified chronicity     Plan: Impression is right knee pain with possible meniscal pathology following fall down the stairs.  Potential slight difference in anterior laxity on examination today but she does have a little guarding.  Trace effusion is present today which is probably most significant.  Plan MRI scan to evaluate for medial and lateral meniscal tear.  See her back after that study.  Follow-Up Instructions: Return for after MRI.   Orders:  Orders Placed This Encounter  Procedures  . MR Knee Right w/o contrast   No orders of the defined types were placed in this encounter.     Procedures: No procedures performed   Clinical Data: No additional findings.  Objective: Vital Signs: There were no vitals taken for this visit.  Physical Exam:   Constitutional: Patient appears well-developed HEENT:  Head: Normocephalic Eyes:EOM are normal Neck: Normal  range of motion Cardiovascular: Normal rate Pulmonary/chest: Effort normal Neurologic: Patient is alert Skin: Skin is warm Psychiatric: Patient has normal mood and affect    Ortho Exam: Orthopedic exam demonstrates full active and passive range of motion of the right knee.  Trace effusion is present.  Anteromedial anterolateral joint line tenderness is present.  Collateral crucial ligaments feel stable although she does have slightly more laxity anteriorly than she had last clinic visit.  Exam is little difficult in this regard due to guarding.  Pedal pulses palpable and there is no nerve root tension signs on the right.  Specialty Comments:  No specialty comments available.  Imaging: No results found.   PMFS History: Patient Active Problem List   Diagnosis Date Noted  . LUQ abdominal pain 11/02/2016  . Nausea 11/02/2016  . Elevated LFTs 10/29/2012  . Abdominal pain, epigastric 10/29/2012  . Atypical chest pain 10/29/2012  . IBS (irritable bowel syndrome) 04/27/2012  . Esophageal pain 04/27/2012   Past Medical History:  Diagnosis Date  . Hx of endometriosis   . Hx of migraine headaches   . Interstitial cystitis   . Osteoarthritis    neck  . Renal stones     Family History  Problem Relation Age of Onset  . Hypertension Mother   . COPD Mother   . Lung cancer Paternal Grandmother   .  Liver cancer Maternal Grandmother   . Lung cancer Maternal Grandmother   . Hypertension Father   . Diverticulosis Father   . Diabetes Father   . Parkinson's disease Father   . Colon cancer Neg Hx     Past Surgical History:  Procedure Laterality Date  . ABDOMINAL HYSTERECTOMY  11/2008   BSO  . CHOLECYSTECTOMY    . ESOPHAGOGASTRODUODENOSCOPY     [name]  . EXPLORATORY LAPAROTOMY     ovarian cyst/endometriosis  . renal stone extraction    . TUBAL LIGATION    . UPJ reconstruction     Social History   Occupational History  . Occupation: Scientist, physiological    Comment: Auditor  Tobacco  Use  . Smoking status: Never Smoker  . Smokeless tobacco: Never Used  Substance and Sexual Activity  . Alcohol use: Yes    Alcohol/week: 0.0 oz    Comment: very rare  . Drug use: No  . Sexual activity: Yes    Partners: Female    Birth control/protection: Surgical

## 2017-12-19 ENCOUNTER — Telehealth (INDEPENDENT_AMBULATORY_CARE_PROVIDER_SITE_OTHER): Payer: Self-pay | Admitting: Orthopedic Surgery

## 2017-12-19 NOTE — Telephone Encounter (Signed)
Working on it.

## 2017-12-19 NOTE — Telephone Encounter (Signed)
Tammy with [facility] said she left a message on your vm earlier about getting authorization for this patients MRI with their facility. She hasn't heard anything back and wanted to make sure it was changed. She left their information:  [facility] [address] [place], [place] [address] NPI # [id number] Phone # [phone]

## 2017-12-20 NOTE — Telephone Encounter (Signed)
Done, changed this am.

## 2017-12-21 DIAGNOSIS — S8992XA Unspecified injury of left lower leg, initial encounter: Secondary | ICD-10-CM | POA: Diagnosis not present

## 2017-12-21 DIAGNOSIS — M25561 Pain in right knee: Secondary | ICD-10-CM | POA: Diagnosis not present

## 2017-12-27 ENCOUNTER — Encounter (INDEPENDENT_AMBULATORY_CARE_PROVIDER_SITE_OTHER): Payer: Self-pay | Admitting: Orthopedic Surgery

## 2017-12-27 ENCOUNTER — Ambulatory Visit (INDEPENDENT_AMBULATORY_CARE_PROVIDER_SITE_OTHER): Payer: BLUE CROSS/BLUE SHIELD | Admitting: Orthopedic Surgery

## 2017-12-27 ENCOUNTER — Telehealth (INDEPENDENT_AMBULATORY_CARE_PROVIDER_SITE_OTHER): Payer: Self-pay

## 2017-12-27 DIAGNOSIS — M25561 Pain in right knee: Secondary | ICD-10-CM

## 2017-12-27 NOTE — Telephone Encounter (Signed)
Can you please get patient pre-approved for gel injection? Thanks.

## 2017-12-28 ENCOUNTER — Telehealth (INDEPENDENT_AMBULATORY_CARE_PROVIDER_SITE_OTHER): Payer: Self-pay

## 2017-12-28 ENCOUNTER — Encounter (INDEPENDENT_AMBULATORY_CARE_PROVIDER_SITE_OTHER): Payer: Self-pay | Admitting: Orthopedic Surgery

## 2017-12-28 NOTE — Telephone Encounter (Signed)
Noted  

## 2017-12-28 NOTE — Telephone Encounter (Signed)
Submitted application online for SynviscOne, right knee. 

## 2017-12-28 NOTE — Progress Notes (Signed)
Office Visit Note   Patient: Nancy Armstrong           Date of Birth: 1971/05/26           MRN: [id number] Visit Date: 12/27/2017 Requested by: Rochel Brome, MD [address] [place], [place] [address] PCP: Rochel Brome, MD  Subjective: Chief Complaint  Patient presents with  . Right Knee - Pain    HPI:   Cadince is a 47 year old patient with right knee pain.  Since I have seen her she had an MRI scan.  That scan shows medial compartment arthritis.  Injection was not helpful.  She takes on a regular basis for her neck Mobic Flexeril and oxycodone.  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              ROS: All systems reviewed are negative as they relate to the chief complaint within the history of present illness.  Patient denies  fevers or chills. Impression is right knee pain with no clearly definable arthroscopically treatable pathology in the knee.  I think we should try a gel shot plus sedentary duty for 3 weeks.  Assessment & Plan: Visit Diagnoses:  1. Right knee pain, unspecified chronicity     Plan: This does not appear to be coming from her back or her hip.  I will see her back in 3 weeks for gel injection  Follow-Up Instructions: Return in about 3 weeks (around 01/17/2018).   Orders:  No orders of the defined types were placed in this encounter.  No orders of the defined types were placed in this encounter.     Procedures: No procedures performed   Clinical Data: No additional findings.  Objective: Vital Signs: There were no vitals taken for this visit.  Physical Exam:   Constitutional: Patient appears well-developed HEENT:  Head: Normocephalic Eyes:EOM are normal Neck: Normal range of motion Cardiovascular: Normal rate Pulmonary/chest: Effort normal Neurologic: Patient is alert Skin: Skin is warm Psychiatric: Patient has normal mood and affect    Ortho Exam: Ortho exam demonstrates good knee range of motion with no  effusion medial greater than lateral joint line tenderness.  Stable collateral cruciate ligaments.  Intact extensor mechanism.  No nerve root tension signs and no groin pain with internal/external rotation of the leg.  Specialty Comments:  No specialty comments available.  Imaging: No results found.   PMFS History: Patient Active Problem List   Diagnosis Date Noted  . LUQ abdominal pain 11/02/2016  . Nausea 11/02/2016  . Elevated LFTs 10/29/2012  . Abdominal pain, epigastric 10/29/2012  . Atypical chest pain 10/29/2012  . IBS (irritable bowel syndrome) 04/27/2012  . Esophageal pain 04/27/2012   Past Medical History:  Diagnosis Date  . Hx of endometriosis   . Hx of migraine headaches   . Interstitial cystitis   . Osteoarthritis    neck  . Renal stones     Family History  Problem Relation Age of Onset  . Hypertension Mother   . COPD Mother   . Lung cancer Paternal Grandmother   . Liver cancer Maternal Grandmother   . Lung cancer Maternal Grandmother   . Hypertension Father   . Diverticulosis Father   . Diabetes Father   . Parkinson's disease Father   . Colon cancer Neg Hx     Past Surgical History:  Procedure Laterality Date  . ABDOMINAL HYSTERECTOMY  11/2008   BSO  . CHOLECYSTECTOMY    .  ESOPHAGOGASTRODUODENOSCOPY     [name]  . EXPLORATORY LAPAROTOMY     ovarian cyst/endometriosis  . renal stone extraction    . TUBAL LIGATION    . UPJ reconstruction     Social History   Occupational History  . Occupation: Scientist, physiological    Comment: Auditor  Tobacco Use  . Smoking status: Never Smoker  . Smokeless tobacco: Never Used  Substance and Sexual Activity  . Alcohol use: Yes    Comment: very rare  . Drug use: No  . Sexual activity: Yes    Partners: Female    Birth control/protection: Surgical

## 2018-01-04 NOTE — Telephone Encounter (Signed)
Patient checking status of approval on synvisc injection. Patients # [phone]

## 2018-01-04 NOTE — Telephone Encounter (Signed)
Will you see if this one is in your stacks I placed on your desk and followup with patient if so? Thanks.

## 2018-01-05 NOTE — Telephone Encounter (Signed)
Patient's paperwork is not in the stack that you put on my desk.

## 2018-01-05 NOTE — Telephone Encounter (Signed)
I pulled VOB from online and Josem Kaufmann is needed.  IC patient and advised we are sending in auth request today and hopefully will let her know more first of the week.  She does not have an appt scheduled yet.

## 2018-01-08 ENCOUNTER — Telehealth (INDEPENDENT_AMBULATORY_CARE_PROVIDER_SITE_OTHER): Payer: Self-pay

## 2018-01-08 NOTE — Telephone Encounter (Signed)
Patient called to see if gel injection had been approved, advised patient that a PA was needed for SynviscOne, right knee and that it was faxed on 01/05/18.  Advised patient that she will receive a call to schedule once PA has been approved by her insurance.

## 2018-01-08 NOTE — Telephone Encounter (Signed)
PA form faxed for SynviscOne, right knee to [phone] per Abigail Butts M.on 01/05/18.

## 2018-01-09 DIAGNOSIS — M25551 Pain in right hip: Secondary | ICD-10-CM | POA: Diagnosis not present

## 2018-01-09 DIAGNOSIS — M545 Low back pain: Secondary | ICD-10-CM | POA: Diagnosis not present

## 2018-01-09 DIAGNOSIS — M533 Sacrococcygeal disorders, not elsewhere classified: Secondary | ICD-10-CM | POA: Diagnosis not present

## 2018-01-09 DIAGNOSIS — G894 Chronic pain syndrome: Secondary | ICD-10-CM | POA: Diagnosis not present

## 2018-01-09 DIAGNOSIS — M5481 Occipital neuralgia: Secondary | ICD-10-CM | POA: Diagnosis not present

## 2018-01-16 ENCOUNTER — Telehealth (INDEPENDENT_AMBULATORY_CARE_PROVIDER_SITE_OTHER): Payer: Self-pay | Admitting: Orthopedic Surgery

## 2018-01-16 NOTE — Telephone Encounter (Signed)
Patient called  Asked for a call back concerning approval for injection. The number to contact patient is [phone]

## 2018-01-17 ENCOUNTER — Telehealth (INDEPENDENT_AMBULATORY_CARE_PROVIDER_SITE_OTHER): Payer: Self-pay

## 2018-01-17 NOTE — Telephone Encounter (Signed)
Talked with patient and advised her that she is approved for SynviscOne, right knee. Buy & Bill Covered at 100% after $50.00 co-pay. PA approved -Auth.# [id number] Valid 01/05/2018- 01/05/2019  Appt. Scheduled 01/29/2018

## 2018-01-17 NOTE — Telephone Encounter (Signed)
Talked with patient concerning gel injection.  Patient has appointment scheduled for SynviscOne, right knee.

## 2018-01-29 ENCOUNTER — Encounter (INDEPENDENT_AMBULATORY_CARE_PROVIDER_SITE_OTHER): Payer: Self-pay | Admitting: Orthopedic Surgery

## 2018-01-29 ENCOUNTER — Ambulatory Visit (INDEPENDENT_AMBULATORY_CARE_PROVIDER_SITE_OTHER): Payer: BLUE CROSS/BLUE SHIELD | Admitting: Orthopedic Surgery

## 2018-01-29 DIAGNOSIS — M1711 Unilateral primary osteoarthritis, right knee: Secondary | ICD-10-CM

## 2018-01-30 ENCOUNTER — Encounter (INDEPENDENT_AMBULATORY_CARE_PROVIDER_SITE_OTHER): Payer: Self-pay | Admitting: Orthopedic Surgery

## 2018-01-30 DIAGNOSIS — M1711 Unilateral primary osteoarthritis, right knee: Secondary | ICD-10-CM

## 2018-01-30 MED ORDER — HYLAN G-F 20 48 MG/6ML IX SOSY
48.0000 mg | PREFILLED_SYRINGE | INTRA_ARTICULAR | Status: AC | PRN
  Administered 2018-01-30: 48 mg via INTRA_ARTICULAR

## 2018-01-30 MED ORDER — LIDOCAINE HCL 1 % IJ SOLN
5.0000 mL | INTRAMUSCULAR | Status: AC | PRN
  Administered 2018-01-30: 5 mL

## 2018-01-30 NOTE — Progress Notes (Signed)
   Procedure Note  Patient: Nancy Armstrong             Date of Birth: Dec 29, 1970           MRN: [id number]             Visit Date: 01/29/2018  Procedures: Visit Diagnoses: Unilateral primary osteoarthritis, right knee  Large Joint Inj: R knee on 01/30/2018 10:43 PM Indications: pain, joint swelling and diagnostic evaluation Details: 18 G 1.5 in needle, superolateral approach  Arthrogram: No  Medications: 5 mL lidocaine 1 %; 48 mg Hylan 48 MG/6ML Outcome: tolerated well, no immediate complications Procedure, treatment alternatives, risks and benefits explained, specific risks discussed. Consent was given by the patient. Immediately prior to procedure a time out was called to verify the correct patient, procedure, equipment, support staff and site/side marked as required. Patient was prepped and draped in the usual sterile fashion.

## 2018-02-08 DIAGNOSIS — M545 Low back pain: Secondary | ICD-10-CM | POA: Diagnosis not present

## 2018-02-08 DIAGNOSIS — M5481 Occipital neuralgia: Secondary | ICD-10-CM | POA: Diagnosis not present

## 2018-02-08 DIAGNOSIS — M25551 Pain in right hip: Secondary | ICD-10-CM | POA: Diagnosis not present

## 2018-02-08 DIAGNOSIS — N3001 Acute cystitis with hematuria: Secondary | ICD-10-CM | POA: Diagnosis not present

## 2018-02-08 DIAGNOSIS — R110 Nausea: Secondary | ICD-10-CM | POA: Diagnosis not present

## 2018-02-08 DIAGNOSIS — M533 Sacrococcygeal disorders, not elsewhere classified: Secondary | ICD-10-CM | POA: Diagnosis not present

## 2018-02-08 DIAGNOSIS — R1084 Generalized abdominal pain: Secondary | ICD-10-CM | POA: Diagnosis not present

## 2018-02-09 DIAGNOSIS — R109 Unspecified abdominal pain: Secondary | ICD-10-CM | POA: Diagnosis not present

## 2018-02-09 DIAGNOSIS — R110 Nausea: Secondary | ICD-10-CM | POA: Diagnosis not present

## 2018-02-09 DIAGNOSIS — N390 Urinary tract infection, site not specified: Secondary | ICD-10-CM | POA: Diagnosis not present

## 2018-03-08 DIAGNOSIS — M533 Sacrococcygeal disorders, not elsewhere classified: Secondary | ICD-10-CM | POA: Diagnosis not present

## 2018-03-08 DIAGNOSIS — M25551 Pain in right hip: Secondary | ICD-10-CM | POA: Diagnosis not present

## 2018-03-08 DIAGNOSIS — M545 Low back pain: Secondary | ICD-10-CM | POA: Diagnosis not present

## 2018-03-08 DIAGNOSIS — M5481 Occipital neuralgia: Secondary | ICD-10-CM | POA: Diagnosis not present

## 2018-04-10 DIAGNOSIS — M5136 Other intervertebral disc degeneration, lumbar region: Secondary | ICD-10-CM | POA: Diagnosis not present

## 2018-04-10 DIAGNOSIS — M5412 Radiculopathy, cervical region: Secondary | ICD-10-CM | POA: Diagnosis not present

## 2018-04-10 DIAGNOSIS — G894 Chronic pain syndrome: Secondary | ICD-10-CM | POA: Diagnosis not present

## 2018-04-11 ENCOUNTER — Ambulatory Visit: Payer: BLUE CROSS/BLUE SHIELD | Admitting: Internal Medicine

## 2018-04-11 ENCOUNTER — Encounter (INDEPENDENT_AMBULATORY_CARE_PROVIDER_SITE_OTHER): Payer: Self-pay | Admitting: Orthopedic Surgery

## 2018-04-11 ENCOUNTER — Encounter: Payer: Self-pay | Admitting: Internal Medicine

## 2018-04-11 ENCOUNTER — Telehealth (INDEPENDENT_AMBULATORY_CARE_PROVIDER_SITE_OTHER): Payer: Self-pay

## 2018-04-11 ENCOUNTER — Ambulatory Visit (INDEPENDENT_AMBULATORY_CARE_PROVIDER_SITE_OTHER): Payer: BLUE CROSS/BLUE SHIELD | Admitting: Orthopedic Surgery

## 2018-04-11 VITALS — BP 100/70 | HR 80 | Ht 64.0 in | Wt 195.0 lb

## 2018-04-11 DIAGNOSIS — R1084 Generalized abdominal pain: Secondary | ICD-10-CM | POA: Diagnosis not present

## 2018-04-11 DIAGNOSIS — M25561 Pain in right knee: Secondary | ICD-10-CM

## 2018-04-11 DIAGNOSIS — R110 Nausea: Secondary | ICD-10-CM

## 2018-04-11 DIAGNOSIS — R197 Diarrhea, unspecified: Secondary | ICD-10-CM

## 2018-04-11 DIAGNOSIS — Z62810 Personal history of physical and sexual abuse in childhood: Secondary | ICD-10-CM | POA: Diagnosis not present

## 2018-04-11 DIAGNOSIS — K625 Hemorrhage of anus and rectum: Secondary | ICD-10-CM

## 2018-04-11 DIAGNOSIS — R933 Abnormal findings on diagnostic imaging of other parts of digestive tract: Secondary | ICD-10-CM
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
## 2018-04-11 NOTE — Telephone Encounter (Signed)
Can we get patient approved for gel injection to be done in January For right knee? Injection last done on 01/29/18

## 2018-04-11 NOTE — Progress Notes (Signed)
Office Visit Note   Patient: Nancy Armstrong           Date of Birth: 1970-11-15           MRN: [id number] Visit Date: 04/11/2018 Requested by: Rochel Brome, MD [address], [place] [address] PCP: Rochel Brome, MD  Subjective: Chief Complaint  Patient presents with  . Right Knee - Pain    HPI: Patient presents for follow-up of right knee.  She got about 50% relief from gel injection 01/29/2018.  She states she is unable to stand for prolonged period of time.  She works as a Cabin crew.  After sitting gets very painful to get up.  Denies any real radicular pain or groin pain.  The pain will wake her from sleep at night at times.  She is had an MRI scan which was negative for operative pathology in the knee              ROS: All systems reviewed are negative as they relate to the chief complaint within the history of present illness.  Patient denies  fevers or chills.   Assessment & Plan: Visit Diagnoses:  1. Right knee pain, unspecified chronicity     Plan: Impression is right knee pain unclear etiology with nothing really clinically obvious to be causing pain of the magnitude that she is having.  She did get some relief from the gel injection.  I think the prudent thing to do at this time would be to have her do some stationary bike exercising non-loadbearing for quad and hamstring strengthening and then follow-up in January for repeat gel injection.  We will see if her symptoms evolved to the point where any type of further intervention would be indicated but at this time I do not think there is a great chance that arthroscopy is going to help her  Follow-Up Instructions: No follow-ups on file.   Orders:  No orders of the defined types were placed in this encounter.  No orders of the defined types were placed in this encounter.     Procedures: No procedures performed   Clinical Data: No additional findings.  Objective: Vital Signs: There were no  vitals taken for this visit.  Physical Exam:   Constitutional: Patient appears well-developed HEENT:  Head: Normocephalic Eyes:EOM are normal Neck: Normal range of motion Cardiovascular: Normal rate Pulmonary/chest: Effort normal Neurologic: Patient is alert Skin: Skin is warm Psychiatric: Patient has normal mood and affect    Ortho Exam: Ortho exam demonstrates full active and passive range of motion of the right knee with no effusion.  No real focal joint line tenderness.  Collateral crucial ligaments are stable.  Mild patellofemoral crepitus is present.  Pedal pulses palpable.  No groin pain with internal/external rotation of the leg and no restriction of hip motion with internal/external rotation of the leg.  Specialty Comments:  No specialty comments available.  Imaging: No results found.   PMFS History: Patient Active Problem List   Diagnosis Date Noted  . LUQ abdominal pain 11/02/2016  . Nausea 11/02/2016  . Elevated LFTs 10/29/2012  . Abdominal pain, epigastric 10/29/2012  . Atypical chest pain 10/29/2012  . IBS (irritable bowel syndrome) 04/27/2012  . Esophageal pain 04/27/2012   Past Medical History:  Diagnosis Date  . Hx of endometriosis   . Hx of migraine headaches   . Interstitial cystitis   . Osteoarthritis    neck  . Renal stones  Family History  Problem Relation Age of Onset  . Hypertension Mother   . COPD Mother   . Lung cancer Paternal Grandmother   . Liver cancer Maternal Grandmother   . Lung cancer Maternal Grandmother   . Hypertension Father   . Diverticulosis Father   . Diabetes Father   . Parkinson's disease Father   . Colon cancer Neg Hx     Past Surgical History:  Procedure Laterality Date  . ABDOMINAL HYSTERECTOMY  11/2008   BSO  . CHOLECYSTECTOMY    . ESOPHAGOGASTRODUODENOSCOPY     [name]  . EXPLORATORY LAPAROTOMY     ovarian cyst/endometriosis  . renal stone extraction    . TUBAL LIGATION    . UPJ reconstruction      Social History   Occupational History  . Occupation: Scientist, physiological    Comment: Auditor  Tobacco Use  . Smoking status: Never Smoker  . Smokeless tobacco: Never Used  Substance and Sexual Activity  . Alcohol use: Yes    Comment: very rare  . Drug use: No  . Sexual activity: Yes    Partners: Female    Birth control/protection: Surgical

## 2018-04-11 NOTE — Patient Instructions (Signed)
You have been scheduled for a colonoscopy. Please follow written instructions given to you at your visit today.  Please pick up your prep supplies at the pharmacy today. If you use inhalers (even only as needed), please bring them with you on the day of your procedure.   I appreciate the opportunity to care for you. Silvano Rusk, MD, [facility]

## 2018-04-12 ENCOUNTER — Ambulatory Visit (AMBULATORY_SURGERY_CENTER): Payer: BLUE CROSS/BLUE SHIELD | Admitting: Internal Medicine

## 2018-04-12 ENCOUNTER — Encounter: Payer: Self-pay | Admitting: Internal Medicine

## 2018-04-12 VITALS — BP 132/77 | HR 76 | Temp 97.1°F | Resp 17 | Ht 64.0 in | Wt 195.0 lb

## 2018-04-12 DIAGNOSIS — D123 Benign neoplasm of transverse colon: Secondary | ICD-10-CM

## 2018-04-12 DIAGNOSIS — K635 Polyp of colon: Secondary | ICD-10-CM | POA: Diagnosis not present

## 2018-04-12 DIAGNOSIS — K625 Hemorrhage of anus and rectum: Secondary | ICD-10-CM | POA: Diagnosis not present

## 2018-04-12 DIAGNOSIS — D126 Benign neoplasm of colon, unspecified: Secondary | ICD-10-CM

## 2018-04-12 DIAGNOSIS — R197 Diarrhea, unspecified: Secondary | ICD-10-CM

## 2018-04-12 DIAGNOSIS — Z86010 Personal history of colonic polyps: Secondary | ICD-10-CM

## 2018-04-12 DIAGNOSIS — D125 Benign neoplasm of sigmoid colon: Secondary | ICD-10-CM | POA: Diagnosis not present

## 2018-04-12 DIAGNOSIS — K633 Ulcer of intestine: Secondary | ICD-10-CM | POA: Diagnosis not present

## 2018-04-12 DIAGNOSIS — Z860101 Personal history of adenomatous and serrated colon polyps: Secondary | ICD-10-CM | POA: Insufficient documentation

## 2018-04-12 HISTORY — DX: Personal history of adenomatous and serrated colon polyps: Z86.0101

## 2018-04-12 HISTORY — DX: Personal history of colonic polyps: Z86.010

## 2018-04-12 MED ORDER — SODIUM CHLORIDE 0.9 % IV SOLN: 500.0000 mL | Freq: Once | INTRAVENOUS | Status: DC

## 2018-04-12 MED ORDER — FLEET ENEMA 7-19 GM/118ML RE ENEM
1.0000 | ENEMA | Freq: Once | RECTAL | Status: AC
  Administered 2018-04-12: 1 via RECTAL

## 2018-04-12 NOTE — Progress Notes (Signed)
Patient administered fleet enema per Dr. Celesta Aver orders.  Patient stated results were clear with little sediment.

## 2018-04-12 NOTE — Patient Instructions (Addendum)
I found and removed 3 polyps (including the one seen on CT).  You are bleeding from hemorrhoids.  I took some colon biopsies to see if there is inflammation causing your symptoms.  Will contact you with results and plans.  I appreciate the opportunity to care for you. Gatha Mayer, MD, FACG  YOU HAD AN ENDOSCOPIC PROCEDURE TODAY AT [facility]:   Refer to the procedure report that was given to you for any specific questions about what was found during the examination.  If the procedure report does not answer your questions, please call your gastroenterologist to clarify.  If you requested that your care partner not be given the details of your procedure findings, then the procedure report has been included in a sealed envelope for you to review at your convenience later.  YOU SHOULD EXPECT: Some feelings of bloating in the abdomen. Passage of more gas than usual.  Walking can help get rid of the air that was put into your GI tract during the procedure and reduce the bloating. If you had a lower endoscopy (such as a colonoscopy or flexible sigmoidoscopy) you may notice spotting of blood in your stool or on the toilet paper. If you underwent a bowel prep for your procedure, you may not have a normal bowel movement for a few days.  Please Note:  You might notice some irritation and congestion in your nose or some drainage.  This is from the oxygen used during your procedure.  There is no need for concern and it should clear up in a day or so.  SYMPTOMS TO REPORT IMMEDIATELY:   Following lower endoscopy (colonoscopy or flexible sigmoidoscopy):  Excessive amounts of blood in the stool  Significant tenderness or worsening of abdominal pains  Swelling of the abdomen that is new, acute  Fever of 100F or higher  For urgent or emergent issues, a gastroenterologist can be reached at any hour by calling [phone].   DIET:  We do recommend a small meal at first, but  then you may proceed to your regular diet.  Drink plenty of fluids but you should avoid alcoholic beverages for 24 hours.  ACTIVITY:  You should plan to take it easy for the rest of today and you should NOT DRIVE or use heavy machinery until tomorrow (because of the sedation medicines used during the test).    FOLLOW UP: Our staff will call the number listed on your records the next business day following your procedure to check on you and address any questions or concerns that you may have regarding the information given to you following your procedure. If we do not reach you, we will leave a message.  However, if you are feeling well and you are not experiencing any problems, there is no need to return our call.  We will assume that you have returned to your regular daily activities without incident.  If any biopsies were taken you will be contacted by phone or by letter within the next 1-3 weeks.  Please call us at [phone] if you have not heard about the biopsies in 3 weeks.    SIGNATURES/CONFIDENTIALITY: You and/or your care partner have signed paperwork which will be entered into your electronic medical record.  These signatures attest to the fact that that the information above on your After Visit Summary has been reviewed and is understood.  Full responsibility of the confidentiality of this discharge information lies with you and/or your care-partner.

## 2018-04-12 NOTE — Op Note (Signed)
[facility] Patient Name: Nancy Armstrong Procedure Date: 04/12/2018 9:01 AM MRN: [id number] Endoscopist: Gatha Mayer , MD Age: 47 Referring MD:  Date of Birth: [birth year] Gender: Female Account #: [id number] Procedure:                Colonoscopy Indications:              Clinically significant diarrhea of unexplained                            origin, Rectal bleeding, Abnormal CT of the GI tract Medicines:                Propofol per Anesthesia, Monitored Anesthesia Care Procedure:                Pre-Anesthesia Assessment:                           - Prior to the procedure, a History and Physical                            was performed, and patient medications and                            allergies were reviewed. The patient's tolerance of                            previous anesthesia was also reviewed. The risks                            and benefits of the procedure and the sedation                            options and risks were discussed with the patient.                            All questions were answered, and informed consent                            was obtained. Prior Anticoagulants: The patient has                            taken no previous anticoagulant or antiplatelet                            agents. ASA Grade Assessment: II - A patient with                            mild systemic disease. After reviewing the risks                            and benefits, the patient was deemed in                            satisfactory condition to undergo the procedure.  After obtaining informed consent, the colonoscope                            was passed under direct vision. Throughout the                            procedure, the patient's blood pressure, pulse, and                            oxygen saturations were monitored continuously. The                            Colonoscope was introduced through the anus and            advanced to the the terminal ileum, with                            identification of the appendiceal orifice and IC                            valve. The colonoscopy was performed without                            difficulty. The patient tolerated the procedure                            well. The quality of the bowel preparation was                            adequate. The terminal ileum, ileocecal valve,                            appendiceal orifice, and rectum were photographed.                            The bowel preparation used was Miralax. Scope In: 9:05:40 AM Scope Out: 9:34:49 AM Scope Withdrawal Time: 0 hours 25 minutes 49 seconds  Total Procedure Duration: 0 hours 29 minutes 9 seconds  Findings:                 The perianal and digital rectal examinations were                            normal.                           Three sessile and semi-pedunculated polyps were                            found in the sigmoid colon and transverse colon.                            The polyps were 3 to 8 mm in size. These polyps  were removed with a cold snare. Resection and                            retrieval were complete. Verification of patient                            identification for the specimen was done. Estimated                            blood loss was minimal.                           A diffuse area of mildly congested and                            vascular-pattern-decreased mucosa was found in the                            sigmoid colon and in the descending colon. Biopsies                            were taken with a cold forceps for histology.                            Verification of patient identification for the                            specimen was done. Estimated blood loss was minimal.                           Internal hemorrhoids were found.                           Anal papilla(e) were hypertrophied.                            The exam was otherwise without abnormality on                            direct and retroflexion views. Complications:            No immediate complications. Estimated Blood Loss:     Estimated blood loss was minimal. Impression:               - Three 3 to 8 mm polyps in the sigmoid colon and                            in the transverse colon, removed with a cold snare.                            Resected and retrieved.                           - Congested and vascular-pattern-decreased mucosa  in the sigmoid colon and in the descending colon.                            Biopsied.                           - Internal hemorrhoids.                           - Anal papilla(e) were hypertrophied.                           - The examination was otherwise normal on direct                            and retroflexion views. Recommendation:           - Patient has a contact number available for                            emergencies. The signs and symptoms of potential                            delayed complications were discussed with the                            patient. Return to normal activities tomorrow.                            Written discharge instructions were provided to the                            patient.                           - Resume previous diet.                           - Continue present medications.                           - Await pathology results.                           - Repeat colonoscopy is recommended. The                            colonoscopy date will be determined after pathology                            results from today's exam become available for                            review. Gatha Mayer, MD 04/12/2018 9:46:51 AM This report has been signed electronically.

## 2018-04-12 NOTE — Progress Notes (Signed)
Alert and oriented x 3, pleased with MAC, report to RN

## 2018-04-12 NOTE — Progress Notes (Signed)
Called to room to assist during endoscopic procedure.  Patient ID and intended procedure confirmed with present staff. Received instructions for my participation in the procedure from the performing physician.  

## 2018-04-12 NOTE — Progress Notes (Signed)
Pt reported murky, thick, yellow liquid on her last BM  Sarah Monday, RN reported to Dr. Carlean Purl.  Her ordered fleet enema. Sarah Monday, RN and Georgeanna Lea, RN assisted pt  enema. maw

## 2018-04-13 ENCOUNTER — Telehealth: Payer: Self-pay

## 2018-04-13 NOTE — Telephone Encounter (Signed)
Left message

## 2018-04-13 NOTE — Telephone Encounter (Signed)
  Follow up Call-  Call back number 04/12/2018 11/14/2016  Post procedure Call Back phone  # [phone] cell [phone]  Permission to leave phone message Yes Yes  Some recent data might be hidden     Left message

## 2018-04-16 NOTE — Telephone Encounter (Signed)
Noted  

## 2018-04-18 ENCOUNTER — Telehealth (INDEPENDENT_AMBULATORY_CARE_PROVIDER_SITE_OTHER): Payer: Self-pay

## 2018-04-18 NOTE — Telephone Encounter (Signed)
Submitted VOB for SynviscOne, right knee. 

## 2018-04-19 ENCOUNTER — Telehealth: Payer: Self-pay | Admitting: Internal Medicine

## 2018-04-20 NOTE — Telephone Encounter (Signed)
Patient notified that she can take her robinul for the cramping.  She is advised no acute problems with the pathology reports and that as soon as Dr. Carlean Purl has an opportunity to thoroughly review we will call her with the results.

## 2018-04-20 NOTE — Telephone Encounter (Signed)
She had one precancerous polyp the others were not. Biopsy showed very nonspecific inflammation that I do not think is related to her problems most likely.  Will need a routine repeat colonoscopy in about 5 years because of the precancerous polyp  Should take her Robinul  I am looking into finding a therapist for her as we discussed have not yet been able to do so.  Hope to have a name for her by next week

## 2018-04-20 NOTE — Telephone Encounter (Signed)
Patient notified

## 2018-04-27 ENCOUNTER — Encounter: Payer: Self-pay | Admitting: Internal Medicine
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
## 2018-05-11 ENCOUNTER — Telehealth (INDEPENDENT_AMBULATORY_CARE_PROVIDER_SITE_OTHER): Payer: Self-pay

## 2018-05-11 DIAGNOSIS — G894 Chronic pain syndrome: Secondary | ICD-10-CM | POA: Diagnosis not present

## 2018-05-11 DIAGNOSIS — M170 Bilateral primary osteoarthritis of knee: Secondary | ICD-10-CM | POA: Diagnosis not present

## 2018-05-11 NOTE — Telephone Encounter (Signed)
Called and left a VM advising patient that she is approved for gel injection, right knee.  Patient approved for SynviscOne, right knee. Buy & Bill Covered at 100% after Co-pay Co-pay of $70.00 PA required PA Approval# [id number] Valid 01/03/2018- 01/06/2019  Appt.needs to be scheduled after 08/01/2018 with Dr. Marlou Sa

## 2018-05-15 ENCOUNTER — Ambulatory Visit: Payer: BLUE CROSS/BLUE SHIELD | Admitting: Psychology

## 2018-05-15 ENCOUNTER — Ambulatory Visit (INDEPENDENT_AMBULATORY_CARE_PROVIDER_SITE_OTHER): Payer: BLUE CROSS/BLUE SHIELD | Admitting: Psychology

## 2018-05-15 DIAGNOSIS — F4312 Post-traumatic stress disorder, chronic: Secondary | ICD-10-CM

## 2018-06-01 ENCOUNTER — Ambulatory Visit (INDEPENDENT_AMBULATORY_CARE_PROVIDER_SITE_OTHER): Payer: BLUE CROSS/BLUE SHIELD | Admitting: Psychology

## 2018-06-01 DIAGNOSIS — F331 Major depressive disorder, recurrent, moderate: Secondary | ICD-10-CM

## 2018-06-06 ENCOUNTER — Ambulatory Visit: Payer: BLUE CROSS/BLUE SHIELD | Admitting: Internal Medicine

## 2018-06-06 ENCOUNTER — Encounter: Payer: Self-pay | Admitting: Internal Medicine

## 2018-06-06 DIAGNOSIS — K582 Mixed irritable bowel syndrome: Secondary | ICD-10-CM

## 2018-06-06 DIAGNOSIS — Z62810 Personal history of physical and sexual abuse in childhood: Secondary | ICD-10-CM | POA: Diagnosis not present

## 2018-06-06 NOTE — Assessment & Plan Note (Addendum)
Increase Metamucil up to 2 tbsp/day MiraLax add in Senokot/Dulcolax prn If that isn't working well call back and we can try Linzess Routine visit 1 year sooner as needed

## 2018-06-06 NOTE — Patient Instructions (Signed)
  Increase your metamucil to 2 tablespoons daily and if you don't have any results you may also add Miralax.  Per Dr Carlean Purl it is okay to use Senokot or Dulcolax.  Call us back if you still have problems.  Follow up with Dr Carlean Purl in one year or sooner if needed.   I appreciate the opportunity to care for you. Silvano Rusk, MD, [facility]
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
## 2018-06-06 NOTE — Assessment & Plan Note (Signed)
Continue therapy with Ms. Nancy Armstrong

## 2018-06-12 DIAGNOSIS — M170 Bilateral primary osteoarthritis of knee: Secondary | ICD-10-CM | POA: Diagnosis not present

## 2018-06-12 DIAGNOSIS — G894 Chronic pain syndrome: Secondary | ICD-10-CM | POA: Diagnosis not present

## 2018-06-12 DIAGNOSIS — M5481 Occipital neuralgia: Secondary | ICD-10-CM | POA: Diagnosis not present

## 2018-06-12 DIAGNOSIS — M533 Sacrococcygeal disorders, not elsewhere classified: Secondary | ICD-10-CM | POA: Diagnosis not present

## 2018-06-15 ENCOUNTER — Ambulatory Visit (INDEPENDENT_AMBULATORY_CARE_PROVIDER_SITE_OTHER): Payer: BLUE CROSS/BLUE SHIELD | Admitting: Psychology

## 2018-06-15 DIAGNOSIS — F4323 Adjustment disorder with mixed anxiety and depressed mood: Secondary | ICD-10-CM | POA: Diagnosis not present

## 2018-06-25 ENCOUNTER — Ambulatory Visit: Payer: Self-pay | Admitting: Psychology

## 2018-06-27 ENCOUNTER — Ambulatory Visit (INDEPENDENT_AMBULATORY_CARE_PROVIDER_SITE_OTHER): Payer: BLUE CROSS/BLUE SHIELD | Admitting: Psychology

## 2018-06-27 DIAGNOSIS — F4323 Adjustment disorder with mixed anxiety and depressed mood: Secondary | ICD-10-CM

## 2018-07-02 DIAGNOSIS — M25561 Pain in right knee: Secondary | ICD-10-CM | POA: Diagnosis not present

## 2018-07-02 DIAGNOSIS — M7051 Other bursitis of knee, right knee: Secondary | ICD-10-CM | POA: Diagnosis not present

## 2018-07-02 DIAGNOSIS — M7631 Iliotibial band syndrome, right leg: Secondary | ICD-10-CM | POA: Diagnosis not present

## 2018-07-02 DIAGNOSIS — M1711 Unilateral primary osteoarthritis, right knee: Secondary | ICD-10-CM | POA: Diagnosis not present

## 2018-07-12 ENCOUNTER — Ambulatory Visit (INDEPENDENT_AMBULATORY_CARE_PROVIDER_SITE_OTHER): Payer: BLUE CROSS/BLUE SHIELD | Admitting: Psychology

## 2018-07-12 DIAGNOSIS — F4321 Adjustment disorder with depressed mood: Secondary | ICD-10-CM

## 2018-07-17 DIAGNOSIS — M170 Bilateral primary osteoarthritis of knee: Secondary | ICD-10-CM | POA: Diagnosis not present

## 2018-07-17 DIAGNOSIS — G894 Chronic pain syndrome: Secondary | ICD-10-CM | POA: Diagnosis not present

## 2018-07-17 DIAGNOSIS — M5481 Occipital neuralgia: Secondary | ICD-10-CM | POA: Diagnosis not present

## 2018-07-17 DIAGNOSIS — M5090 Cervical disc disorder, unspecified, unspecified cervical region: Secondary | ICD-10-CM | POA: Diagnosis not present

## 2018-07-17 DIAGNOSIS — M533 Sacrococcygeal disorders, not elsewhere classified: Secondary | ICD-10-CM | POA: Diagnosis not present

## 2018-07-27 ENCOUNTER — Telehealth (INDEPENDENT_AMBULATORY_CARE_PROVIDER_SITE_OTHER): Payer: Self-pay

## 2018-07-27 NOTE — Telephone Encounter (Signed)
Resubmitted for SynviscOne, right knee due to new year. Patient previously approved for SynviscOne, right knee.

## 2018-08-02 DIAGNOSIS — R51 Headache: Secondary | ICD-10-CM | POA: Diagnosis not present

## 2018-08-02 DIAGNOSIS — J069 Acute upper respiratory infection, unspecified: Secondary | ICD-10-CM | POA: Diagnosis not present

## 2018-08-07 ENCOUNTER — Telehealth (INDEPENDENT_AMBULATORY_CARE_PROVIDER_SITE_OTHER): Payer: Self-pay

## 2018-08-07 NOTE — Telephone Encounter (Signed)
PA required for SynviscOne, right knee. Faxed completed PA form to [facility] at [phone]. Previous PA Approval# [id number] Valid 01/03/2018- 01/06/2019

## 2018-08-08 ENCOUNTER — Ambulatory Visit (INDEPENDENT_AMBULATORY_CARE_PROVIDER_SITE_OTHER): Payer: BLUE CROSS/BLUE SHIELD | Admitting: Orthopedic Surgery

## 2018-08-08 ENCOUNTER — Encounter (INDEPENDENT_AMBULATORY_CARE_PROVIDER_SITE_OTHER): Payer: Self-pay | Admitting: Orthopedic Surgery

## 2018-08-08 DIAGNOSIS — M1711 Unilateral primary osteoarthritis, right knee: Secondary | ICD-10-CM

## 2018-08-12 ENCOUNTER — Encounter (INDEPENDENT_AMBULATORY_CARE_PROVIDER_SITE_OTHER): Payer: Self-pay | Admitting: Orthopedic Surgery

## 2018-08-12 DIAGNOSIS — M1711 Unilateral primary osteoarthritis, right knee: Secondary | ICD-10-CM

## 2018-08-12 MED ORDER — LIDOCAINE HCL 1 % IJ SOLN
5.0000 mL | INTRAMUSCULAR | Status: AC | PRN
  Administered 2018-08-12: 5 mL

## 2018-08-12 MED ORDER — HYLAN G-F 20 48 MG/6ML IX SOSY
48.0000 mg | PREFILLED_SYRINGE | INTRA_ARTICULAR | Status: AC | PRN
  Administered 2018-08-12: 48 mg via INTRA_ARTICULAR

## 2018-08-12 NOTE — Progress Notes (Signed)
   Procedure Note  Patient: Nancy Armstrong             Date of Birth: 1970/10/05           MRN: [id number]             Visit Date: 08/08/2018  Procedures: Visit Diagnoses: Unilateral primary osteoarthritis, right knee  Large Joint Inj: R knee on 08/12/2018 9:04 PM Indications: pain, joint swelling and diagnostic evaluation Details: 18 G 1.5 in needle, superolateral approach  Arthrogram: No  Medications: 5 mL lidocaine 1 %; 48 mg Hylan 48 MG/6ML Outcome: tolerated well, no immediate complications Procedure, treatment alternatives, risks and benefits explained, specific risks discussed. Consent was given by the patient. Immediately prior to procedure a time out was called to verify the correct patient, procedure, equipment, support staff and site/side marked as required. Patient was prepped and draped in the usual sterile fashion.

## 2018-08-13 ENCOUNTER — Telehealth (INDEPENDENT_AMBULATORY_CARE_PROVIDER_SITE_OTHER): Payer: Self-pay

## 2018-08-13 NOTE — Telephone Encounter (Signed)
Patient is approved for SynviscOne, right knee. Buy & Bill Covered at 100% after Co-pay Co-pay of $140.00 PA required PA Approval# [id number] Valid 08/07/2018- 08/07/2019

## 2018-08-14 DIAGNOSIS — M5481 Occipital neuralgia: Secondary | ICD-10-CM | POA: Diagnosis not present

## 2018-08-14 DIAGNOSIS — M533 Sacrococcygeal disorders, not elsewhere classified: Secondary | ICD-10-CM | POA: Diagnosis not present

## 2018-08-14 DIAGNOSIS — M47812 Spondylosis without myelopathy or radiculopathy, cervical region: Secondary | ICD-10-CM | POA: Diagnosis not present

## 2018-08-14 DIAGNOSIS — M170 Bilateral primary osteoarthritis of knee: Secondary | ICD-10-CM | POA: Diagnosis not present

## 2018-08-21 DIAGNOSIS — J069 Acute upper respiratory infection, unspecified: Secondary | ICD-10-CM | POA: Diagnosis not present

## 2018-09-10 DIAGNOSIS — M5090 Cervical disc disorder, unspecified, unspecified cervical region: Secondary | ICD-10-CM | POA: Diagnosis not present

## 2018-09-10 DIAGNOSIS — M533 Sacrococcygeal disorders, not elsewhere classified: Secondary | ICD-10-CM | POA: Diagnosis not present

## 2018-09-10 DIAGNOSIS — M170 Bilateral primary osteoarthritis of knee: Secondary | ICD-10-CM | POA: Diagnosis not present

## 2018-09-10 DIAGNOSIS — M5481 Occipital neuralgia: Secondary | ICD-10-CM | POA: Diagnosis not present

## 2018-10-11 DIAGNOSIS — M5090 Cervical disc disorder, unspecified, unspecified cervical region: Secondary | ICD-10-CM | POA: Diagnosis not present

## 2018-10-11 DIAGNOSIS — M533 Sacrococcygeal disorders, not elsewhere classified: Secondary | ICD-10-CM | POA: Diagnosis not present

## 2018-10-11 DIAGNOSIS — M170 Bilateral primary osteoarthritis of knee: Secondary | ICD-10-CM | POA: Diagnosis not present

## 2018-10-11 DIAGNOSIS — M5481 Occipital neuralgia: Secondary | ICD-10-CM | POA: Diagnosis not present

## 2019-01-28 ENCOUNTER — Telehealth: Payer: Self-pay | Admitting: Internal Medicine

## 2019-01-28 NOTE — Telephone Encounter (Signed)
Patient reports abdominal pain and cramping that is not responding to dicyclomine or robinul.  Patient will come in and see Nicoletta Ba PA tomorrow at 8:30

## 2019-01-28 NOTE — Telephone Encounter (Signed)
Pt requested a call back to discuss stomach issues.

## 2019-01-29 ENCOUNTER — Encounter: Payer: Self-pay | Admitting: Physician Assistant

## 2019-01-29 ENCOUNTER — Ambulatory Visit: Payer: Managed Care, Other (non HMO) | Admitting: Physician Assistant

## 2019-01-29 ENCOUNTER — Other Ambulatory Visit (INDEPENDENT_AMBULATORY_CARE_PROVIDER_SITE_OTHER): Payer: Managed Care, Other (non HMO)

## 2019-01-29 VITALS — BP 104/76 | HR 104 | Temp 98.1°F | Ht 64.25 in | Wt 195.4 lb

## 2019-01-29 DIAGNOSIS — R109 Unspecified abdominal pain: Secondary | ICD-10-CM

## 2019-01-29 DIAGNOSIS — Z8719 Personal history of other diseases of the digestive system: Secondary | ICD-10-CM | POA: Diagnosis not present

## 2019-01-29 DIAGNOSIS — R110 Nausea: Secondary | ICD-10-CM

## 2019-01-29 LAB — CBC WITH DIFFERENTIAL/PLATELET
Basophils Absolute: 0.1 10*3/uL (ref 0.0–0.1)
Basophils Relative: 1.3 % (ref 0.0–3.0)
Eosinophils Absolute: 0.2 10*3/uL (ref 0.0–0.7)
Eosinophils Relative: 2.2 % (ref 0.0–5.0)
HCT: 40.5 % (ref 36.0–46.0)
Hemoglobin: 13.5 g/dL (ref 12.0–15.0)
Lymphocytes Relative: 21.3 % (ref 12.0–46.0)
Lymphs Abs: 1.6 10*3/uL (ref 0.7–4.0)
MCHC: 33.3 g/dL (ref 30.0–36.0)
MCV: 84.9 fl (ref 78.0–100.0)
Monocytes Absolute: 0.6 10*3/uL (ref 0.1–1.0)
Monocytes Relative: 7.6 % (ref 3.0–12.0)
Neutro Abs: 5.1 10*3/uL (ref 1.4–7.7)
Neutrophils Relative %: 67.6 % (ref 43.0–77.0)
Platelets: 358 10*3/uL (ref 150.0–400.0)
RBC: 4.77 Mil/uL (ref 3.87–5.11)
RDW: 14.5 % (ref 11.5–15.5)
WBC: 7.5 10*3/uL (ref 4.0–10.5)

## 2019-01-29 LAB — COMPREHENSIVE METABOLIC PANEL
ALT: 15 U/L (ref 0–35)
AST: 13 U/L (ref 0–37)
Albumin: 4.4 g/dL (ref 3.5–5.2)
Alkaline Phosphatase: 118 U/L — ABNORMAL HIGH (ref 39–117)
BUN: 10 mg/dL (ref 6–23)
CO2: 30 mEq/L (ref 19–32)
Calcium: 9.4 mg/dL (ref 8.4–10.5)
Chloride: 101 mEq/L (ref 96–112)
Creatinine, Ser: 0.81 mg/dL (ref 0.40–1.20)
GFR: 75.34 mL/min (ref >60.00)
Glucose, Bld: 95 mg/dL (ref 70–99)
Potassium: 4 mEq/L (ref 3.5–5.1)
Sodium: 139 mEq/L (ref 135–145)
Total Bilirubin: 0.4 mg/dL (ref 0.2–1.2)
Total Protein: 7.5 g/dL (ref 6.0–8.3)

## 2019-01-29 LAB — HIGH SENSITIVITY CRP: CRP, High Sensitivity: 13.78 mg/L — ABNORMAL HIGH (ref 0.000–5.000)

## 2019-01-29 LAB — SEDIMENTATION RATE: Sed Rate: 49 mm/hr — ABNORMAL HIGH (ref 0–20)

## 2019-01-29 MED ORDER — HYOSCYAMINE SULFATE 0.125 MG SL SUBL: 0.1250 mg | SUBLINGUAL_TABLET | SUBLINGUAL | 2 refills | Status: DC | PRN

## 2019-01-29 MED ORDER — DICYCLOMINE HCL 10 MG PO CAPS: 10.0000 mg | ORAL_CAPSULE | Freq: Three times a day (TID) | ORAL | 0 refills | Status: DC

## 2019-01-29 NOTE — Patient Instructions (Signed)
If you are age 48 or older, your body mass index should be between 23-30. Your Body mass index is 33.28 kg/m. If this is out of the aforementioned range listed, please consider follow up with your Primary Care Provider.  If you are age 73 or younger, your body mass index should be between 19-25. Your Body mass index is 33.28 kg/m. If this is out of the aformentioned range listed, please consider follow up with your Primary Care Provider.   You have been scheduled for a CT scan of the abdomen and pelvis at [place] are scheduled on 01/31/19 at 11 am. You should arrive 15 minutes prior to your appointment time for registration. Please follow the written instructions below on the day of your exam:  WARNING: IF YOU ARE ALLERGIC TO IODINE/X-RAY DYE, PLEASE NOTIFY RADIOLOGY IMMEDIATELY AT [phone]! YOU WILL BE GIVEN A 13 HOUR PREMEDICATION PREP.  1) Do not eat or drink anything after 7 am (4 hours prior to your test) 2) You have been given 2 bottles of oral contrast to drink. The solution may taste better if refrigerated, but do NOT add ice or any other liquid to this solution. Shake well before drinking.    Drink 1 bottle of contrast @ 5 am (2 hours prior to your exam)  Drink 1 bottle of contrast @ 6 am (1 hour prior to your exam)  You may take any medications as prescribed with a small amount of water, if necessary. If you take any of the following medications: METFORMIN, GLUCOPHAGE, GLUCOVANCE, AVANDAMET, RIOMET, FORTAMET, Howell MET, JANUMET, GLUMETZA or METAGLIP, you MAY be asked to HOLD this medication 48 hours AFTER the exam.  The purpose of you drinking the oral contrast is to aid in the visualization of your intestinal tract. The contrast solution may cause some diarrhea. Depending on your individual set of symptoms, you may also receive an intravenous injection of x-ray contrast/dye. Plan on being at [facility] for 30 minutes or longer, depending on the type of  exam you are having performed.  This test typically takes 30-45 minutes to complete.  If you have any questions regarding your exam or if you need to reschedule, you may call the CT department at [phone] between the hours of 8:00 am and 5:00 pm, Monday-Friday.  _____________________________________________________________________  Your provider has requested that you go to the basement level for lab work before leaving today. Press "B" on the elevator. The lab is located at the first door on the left as you exit the elevator.  We have sent the following medications to your pharmacy for you to pick up at your convenience: Bentyl Levsin  We are requesting records from [facility].  Follow up with Dr. Carlean Purl on February 19, 2019 at 11:25 am.  Thank you for choosing me and [facility].   Amy Esterwood, PA-C
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
## 2019-01-31 ENCOUNTER — Ambulatory Visit (HOSPITAL_COMMUNITY): Payer: Managed Care, Other (non HMO)

## 2019-02-04 ENCOUNTER — Ambulatory Visit (HOSPITAL_COMMUNITY)
Admission: RE | Admit: 2019-02-04 | Discharge: 2019-02-04 | Disposition: A | Discharge status: Home / Self Care | Payer: Managed Care, Other (non HMO) | Source: Ambulatory Visit | Attending: Physician Assistant | Admitting: Physician Assistant

## 2019-02-04 ENCOUNTER — Encounter (HOSPITAL_COMMUNITY): Payer: Self-pay

## 2019-02-04 ENCOUNTER — Other Ambulatory Visit: Payer: Self-pay

## 2019-02-04 DIAGNOSIS — R109 Unspecified abdominal pain: Secondary | ICD-10-CM

## 2019-02-04 DIAGNOSIS — R110 Nausea: Secondary | ICD-10-CM | POA: Diagnosis present

## 2019-02-04 DIAGNOSIS — Z8719 Personal history of other diseases of the digestive system: Secondary | ICD-10-CM

## 2019-02-04 MED ORDER — IOHEXOL 300 MG/ML  SOLN
100.0000 mL | Freq: Once | INTRAMUSCULAR | Status: AC | PRN
  Administered 2019-02-04: 100 mL via INTRAVENOUS

## 2019-02-04 MED ORDER — SODIUM CHLORIDE (PF) 0.9 % IJ SOLN
INTRAMUSCULAR | Status: AC
  Filled 2019-02-04: qty 50

## 2019-02-06 ENCOUNTER — Other Ambulatory Visit: Payer: Self-pay

## 2019-02-06 ENCOUNTER — Telehealth: Payer: Self-pay | Admitting: Internal Medicine

## 2019-02-06 DIAGNOSIS — R109 Unspecified abdominal pain: Secondary | ICD-10-CM

## 2019-02-06 NOTE — Telephone Encounter (Signed)
Patient called in requesting to speak with the nurse. She stated that she spoke with the nurse and was advised of what the doctor said however she is needing some clarification of what was shared with her.

## 2019-02-06 NOTE — Telephone Encounter (Signed)
Patients questions answered

## 2019-02-19 ENCOUNTER — Other Ambulatory Visit (INDEPENDENT_AMBULATORY_CARE_PROVIDER_SITE_OTHER): Payer: Managed Care, Other (non HMO)

## 2019-02-19 ENCOUNTER — Ambulatory Visit: Payer: Managed Care, Other (non HMO) | Admitting: Internal Medicine

## 2019-02-19 ENCOUNTER — Encounter: Payer: Self-pay | Admitting: Internal Medicine

## 2019-02-19 VITALS — BP 110/76 | HR 86 | Temp 98.2°F | Ht 64.0 in | Wt 195.8 lb

## 2019-02-19 DIAGNOSIS — G8929 Other chronic pain: Secondary | ICD-10-CM

## 2019-02-19 DIAGNOSIS — R1013 Epigastric pain: Secondary | ICD-10-CM

## 2019-02-19 DIAGNOSIS — K588 Other irritable bowel syndrome: Secondary | ICD-10-CM

## 2019-02-19 DIAGNOSIS — G4709 Other insomnia: Secondary | ICD-10-CM | POA: Diagnosis not present

## 2019-02-19 DIAGNOSIS — R109 Unspecified abdominal pain: Secondary | ICD-10-CM

## 2019-02-19 LAB — HM MAMMOGRAPHY

## 2019-02-19 LAB — C-REACTIVE PROTEIN: CRP: 1.7 mg/dL (ref 0.5–20.0)

## 2019-02-19 LAB — CREATININE, SERUM: Creatinine, Ser: 0.86 mg/dL (ref 0.40–1.20)

## 2019-02-19 LAB — SEDIMENTATION RATE: Sed Rate: 45 mm/hr — ABNORMAL HIGH (ref 0–20)

## 2019-02-19 MED ORDER — AMITRIPTYLINE HCL 25 MG PO TABS: 25.0000 mg | ORAL_TABLET | Freq: Every day | ORAL | 1 refills | Status: DC

## 2019-02-19 NOTE — Assessment & Plan Note (Addendum)
Remains working dx in her situation.  I know she has abnormal inflammatory markers at times but other objective measures have been negative.  Certainly fits the profile for somebody to be more likely to have IBS.  Dicyclomine caused constipation so we will stop that or use just as needed.  Go ahead and evaluate with the porphyria work-up since she thinks it flares coming on.  I explained this is a rare disease and unlikely but we would want to miss it.  Otherwise we have reassuring information with CT scanning and previous endoscopies that were unrevealing.  She has had some surgery so she could be having partial small bowel obstructions as well but clinical scenario fits with that possibly as well.  I have checked sed rate and regular C-reactive protein today the sed rate returned somewhat elevated the C-reactive protein was normal.  Porphyria work-up pending

## 2019-02-19 NOTE — Patient Instructions (Signed)
Your provider has requested that you go to the basement level for lab work before leaving today. Press "B" on the elevator. The lab is located at the first door on the left as you exit the elevator.   We have sent the following medications to your pharmacy for you to pick up at your convenience: Amitriptyline   We want to see you back on    I appreciate the opportunity to care for you. Ronney Lion, [facility]
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
## 2019-02-21 NOTE — Progress Notes (Signed)
Picking up where I left off   These tests suggest some inflammation  Let's see what the other tests tell us and I will follow-up again  Gatha Mayer, MD, [facility]

## 2019-02-21 NOTE — Progress Notes (Signed)
Nancy Armstrong,  Labs show one inflammatory marker normal and the other elevated.  As I think I explained this is not specific for gut -

## 2019-02-22 ENCOUNTER — Other Ambulatory Visit: Payer: Self-pay | Admitting: Physician Assistant

## 2019-02-23 LAB — PORPHOBILINOGEN, RANDOM URINE: Porphobilinogen, Rand Ur: 0.6 mg/L (ref 0.0–2.0)

## 2019-02-26 LAB — PORPHYRINS, FRACTIONATED, RANDOM URINE
COPROPORPHYRIN I: 17.7 mcg/g creat (ref 6.5–33.2)
Coproporphyrin III (PORFRU): 19.5 mcg/g creat (ref 4.8–88.6)
TOTAL PORPHYRINS: 47.6 mcg/g creat (ref 27.0–153.6)
Uroporphyrin I: 10.4 mcg/g creat (ref 3.6–21.1)

## 2019-03-20 ENCOUNTER — Other Ambulatory Visit: Payer: Self-pay | Admitting: Physician Assistant

## 2019-03-29 ENCOUNTER — Ambulatory Visit: Payer: Managed Care, Other (non HMO) | Admitting: Internal Medicine

## 2019-05-25 ENCOUNTER — Other Ambulatory Visit: Payer: Self-pay | Admitting: Physician Assistant

## 2019-07-29 ENCOUNTER — Other Ambulatory Visit: Payer: Self-pay

## 2019-07-29 MED ORDER — AMITRIPTYLINE HCL 25 MG PO TABS: 25.0000 mg | ORAL_TABLET | Freq: Every day | ORAL | 1 refills | Status: DC

## 2019-09-12 LAB — LAB REPORT - SCANNED
ALT: 19 (ref 3–30)
AST: 37
BUN: 8
Creatinine, Ser: 0.8
Glucose, Bld: 105 — AB (ref 70–99)
HCT: 42 — AB (ref 29–41)
Hemoglobin: 14.4
Platelet: 336
Potassium: 3.5
RBC: 5.02
Sodium: 142
WBC: 6.6

## 2019-09-26 ENCOUNTER — Encounter: Payer: Self-pay | Admitting: Family Medicine

## 2019-09-27 ENCOUNTER — Other Ambulatory Visit: Payer: Self-pay

## 2019-10-02 ENCOUNTER — Other Ambulatory Visit: Payer: Self-pay

## 2019-10-02 ENCOUNTER — Ambulatory Visit: Payer: Managed Care, Other (non HMO) | Admitting: Family Medicine

## 2019-10-02 ENCOUNTER — Encounter: Payer: Self-pay | Admitting: Legal Medicine

## 2019-10-02 ENCOUNTER — Ambulatory Visit: Payer: Managed Care, Other (non HMO) | Admitting: Legal Medicine

## 2019-10-02 DIAGNOSIS — G43109 Migraine with aura, not intractable, without status migrainosus: Secondary | ICD-10-CM

## 2019-10-02 DIAGNOSIS — G8929 Other chronic pain: Secondary | ICD-10-CM | POA: Insufficient documentation

## 2019-10-02 DIAGNOSIS — M545 Low back pain, unspecified: Secondary | ICD-10-CM

## 2019-10-02 DIAGNOSIS — H8109 Meniere's disease, unspecified ear: Secondary | ICD-10-CM | POA: Insufficient documentation

## 2019-10-02 DIAGNOSIS — N3010 Interstitial cystitis (chronic) without hematuria: Secondary | ICD-10-CM | POA: Insufficient documentation

## 2019-10-02 DIAGNOSIS — M1990 Unspecified osteoarthritis, unspecified site: Secondary | ICD-10-CM | POA: Insufficient documentation

## 2019-10-02 DIAGNOSIS — K5900 Constipation, unspecified: Secondary | ICD-10-CM | POA: Insufficient documentation

## 2019-10-02 DIAGNOSIS — K219 Gastro-esophageal reflux disease without esophagitis: Secondary | ICD-10-CM | POA: Insufficient documentation

## 2019-10-02 DIAGNOSIS — F411 Generalized anxiety disorder: Secondary | ICD-10-CM

## 2019-10-02 DIAGNOSIS — E782 Mixed hyperlipidemia: Secondary | ICD-10-CM | POA: Insufficient documentation

## 2019-10-02 DIAGNOSIS — G894 Chronic pain syndrome: Secondary | ICD-10-CM

## 2019-10-02 DIAGNOSIS — Z87442 Personal history of urinary calculi: Secondary | ICD-10-CM | POA: Insufficient documentation

## 2019-10-02 DIAGNOSIS — M797 Fibromyalgia: Secondary | ICD-10-CM | POA: Insufficient documentation

## 2019-10-02 HISTORY — DX: Mixed hyperlipidemia: E78.2

## 2019-10-02 HISTORY — DX: Generalized anxiety disorder: F41.1

## 2019-10-02 HISTORY — DX: Migraine with aura, not intractable, without status migrainosus: G43.109

## 2019-10-02 HISTORY — DX: Chronic pain syndrome: G89.4

## 2019-10-02 HISTORY — DX: Low back pain, unspecified: M54.50

## 2019-10-02 MED ORDER — PROMETHAZINE HCL 25 MG/ML IJ SOLN
25.0000 mg | Freq: Once | INTRAMUSCULAR | Status: AC
  Administered 2019-10-02: 25 mg via INTRAMUSCULAR

## 2019-10-02 MED ORDER — KETOROLAC TROMETHAMINE 60 MG/2ML IM SOLN
60.0000 mg | Freq: Once | INTRAMUSCULAR | Status: AC
  Administered 2019-10-02: 60 mg via INTRAMUSCULAR

## 2019-10-02 MED ORDER — PROMETHAZINE HCL 25 MG PO TABS: 25.0000 mg | ORAL_TABLET | Freq: Three times a day (TID) | ORAL | 0 refills | Status: DC | PRN

## 2019-10-02 NOTE — Assessment & Plan Note (Signed)
Patient treated with toradol 60mg  and 25mg  phenergan IM.
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
## 2019-10-03 ENCOUNTER — Other Ambulatory Visit: Payer: Self-pay | Admitting: Family Medicine

## 2019-10-22 ENCOUNTER — Telehealth: Payer: Self-pay

## 2019-10-22 NOTE — Telephone Encounter (Signed)
PA for Aimovig submitted and approved via cover my meds.  

## 2019-10-24 ENCOUNTER — Encounter: Payer: Self-pay | Admitting: Internal Medicine

## 2019-10-24 ENCOUNTER — Ambulatory Visit: Payer: Managed Care, Other (non HMO) | Admitting: Internal Medicine

## 2019-10-24 VITALS — BP 100/70 | HR 96 | Temp 98.1°F | Ht 64.25 in | Wt 187.2 lb

## 2019-10-24 DIAGNOSIS — K588 Other irritable bowel syndrome: Secondary | ICD-10-CM | POA: Diagnosis not present

## 2019-10-24 DIAGNOSIS — G8929 Other chronic pain: Secondary | ICD-10-CM | POA: Diagnosis not present

## 2019-10-24 DIAGNOSIS — R1084 Generalized abdominal pain: Secondary | ICD-10-CM | POA: Diagnosis not present

## 2019-10-24 DIAGNOSIS — M797 Fibromyalgia: Secondary | ICD-10-CM

## 2019-10-24 MED ORDER — DULOXETINE HCL 60 MG PO CPEP: 60.0000 mg | ORAL_CAPSULE | Freq: Every day | ORAL | 0 refills | Status: DC

## 2019-10-24 MED ORDER — DULOXETINE HCL 30 MG PO CPEP: 30.0000 mg | ORAL_CAPSULE | Freq: Every day | ORAL | 0 refills | Status: DC

## 2019-10-24 NOTE — Patient Instructions (Addendum)
If you are age 49 or older, your body mass index should be between 23-30. Your Body mass index is 31.89 kg/m. If this is out of the aforementioned range listed, please consider follow up with your Primary Care Provider.  If you are age 34 or younger, your body mass index should be between 19-25. Your Body mass index is 31.89 kg/m. If this is out of the aformentioned range listed, please consider follow up with your Primary Care Provider.   We have sent the following medications to your pharmacy for you to pick up at your convenience: Duloxetine 30 mg Duloxetine 60 mg  Taper off Savella -take every other day for 1-2 weeks then stop. Start Duloxetine 30 mg for one week, then take Duloxetine 60 mg.  Follow up with me on 12/24/19 at 2:10 pm.  Thank you for choosing me and [facility].   Silvano Rusk, MD
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
## 2019-10-28 ENCOUNTER — Other Ambulatory Visit: Payer: Self-pay

## 2019-10-28 ENCOUNTER — Ambulatory Visit: Payer: Managed Care, Other (non HMO) | Admitting: Family Medicine

## 2019-10-28 ENCOUNTER — Encounter: Payer: Self-pay | Admitting: Family Medicine

## 2019-10-28 VITALS — BP 110/76 | HR 72 | Temp 94.4°F | Resp 16 | Ht 64.0 in | Wt 188.4 lb

## 2019-10-28 DIAGNOSIS — M5440 Lumbago with sciatica, unspecified side: Secondary | ICD-10-CM

## 2019-10-28 DIAGNOSIS — R3129 Other microscopic hematuria: Secondary | ICD-10-CM

## 2019-10-28 DIAGNOSIS — R319 Hematuria, unspecified: Secondary | ICD-10-CM

## 2019-10-28 DIAGNOSIS — F4310 Post-traumatic stress disorder, unspecified: Secondary | ICD-10-CM

## 2019-10-28 DIAGNOSIS — E782 Mixed hyperlipidemia: Secondary | ICD-10-CM

## 2019-10-28 DIAGNOSIS — M797 Fibromyalgia: Secondary | ICD-10-CM

## 2019-10-28 DIAGNOSIS — G8929 Other chronic pain: Secondary | ICD-10-CM

## 2019-10-28 LAB — POCT URINALYSIS DIP (CLINITEK)
Bilirubin, UA: NEGATIVE
Glucose, UA: NEGATIVE mg/dL
Ketones, POC UA: NEGATIVE mg/dL
Leukocytes, UA: NEGATIVE
Nitrite, UA: NEGATIVE
Spec Grav, UA: 1.025 (ref 1.010–1.025)
Urobilinogen, UA: 0.2 E.U./dL
pH, UA: 6 (ref 5.0–8.0)

## 2019-10-28 MED ORDER — NURTEC 75 MG PO TBDP: 1.0000 | ORAL_TABLET | Freq: Every day | ORAL | 0 refills | Status: DC

## 2019-10-28 NOTE — Patient Instructions (Signed)
Refer to [facility] for pain management and possible back intervention. Decrease savella to 50 mg once daily x 1 week, then discontinue while increasing cymbalta as previously instructed.  Labs done.  Refill nurtec.  Recommend Dr. Vivien Rossetti.

## 2019-10-29 ENCOUNTER — Other Ambulatory Visit: Payer: Self-pay

## 2019-10-29 ENCOUNTER — Telehealth: Payer: Self-pay

## 2019-10-29 LAB — COMPREHENSIVE METABOLIC PANEL
ALT: 21 IU/L (ref 0–32)
AST: 24 IU/L (ref 0–40)
Albumin/Globulin Ratio: 1.6 (ref 1.2–2.2)
Albumin: 4.1 g/dL (ref 3.8–4.8)
Alkaline Phosphatase: 123 IU/L — ABNORMAL HIGH (ref 39–117)
BUN/Creatinine Ratio: 11 (ref 9–23)
BUN: 11 mg/dL (ref 6–24)
Bilirubin Total: <0.2 mg/dL (ref 0.0–1.2)
CO2: 25 mmol/L (ref 20–29)
Calcium: 9.7 mg/dL (ref 8.7–10.2)
Chloride: 99 mmol/L (ref 96–106)
Creatinine, Ser: 0.98 mg/dL (ref 0.57–1.00)
GFR calc Af Amer: 78 mL/min/{1.73_m2} (ref >59)
GFR calc non Af Amer: 68 mL/min/{1.73_m2} (ref >59)
Globulin, Total: 2.6 g/dL (ref 1.5–4.5)
Glucose: 91 mg/dL (ref 65–99)
Potassium: 4.5 mmol/L (ref 3.5–5.2)
Sodium: 141 mmol/L (ref 134–144)
Total Protein: 6.7 g/dL (ref 6.0–8.5)

## 2019-10-29 LAB — CBC WITH DIFFERENTIAL/PLATELET
Basophils Absolute: 0.1 10*3/uL (ref 0.0–0.2)
Basos: 1 %
EOS (ABSOLUTE): 0.3 10*3/uL (ref 0.0–0.4)
Eos: 5 %
Hematocrit: 41.1 % (ref 34.0–46.6)
Hemoglobin: 13.3 g/dL (ref 11.1–15.9)
Immature Grans (Abs): 0 10*3/uL (ref 0.0–0.1)
Immature Granulocytes: 0 %
Lymphocytes Absolute: 2.4 10*3/uL (ref 0.7–3.1)
Lymphs: 39 %
MCH: 28.3 pg (ref 26.6–33.0)
MCHC: 32.4 g/dL (ref 31.5–35.7)
MCV: 87 fL (ref 79–97)
Monocytes Absolute: 0.5 10*3/uL (ref 0.1–0.9)
Monocytes: 7 %
Neutrophils Absolute: 3 10*3/uL (ref 1.4–7.0)
Neutrophils: 48 %
Platelets: 357 10*3/uL (ref 150–450)
RBC: 4.7 x10E6/uL (ref 3.77–5.28)
RDW: 14.3 % (ref 11.7–15.4)
WBC: 6.2 10*3/uL (ref 3.4–10.8)

## 2019-10-29 LAB — LIPID PANEL
Chol/HDL Ratio: 4.8 ratio — ABNORMAL HIGH (ref 0.0–4.4)
Cholesterol, Total: 243 mg/dL — ABNORMAL HIGH (ref 100–199)
HDL: 51 mg/dL (ref >39)
LDL Chol Calc (NIH): 152 mg/dL — ABNORMAL HIGH (ref 0–99)
Triglycerides: 218 mg/dL — ABNORMAL HIGH (ref 0–149)
VLDL Cholesterol Cal: 40 mg/dL (ref 5–40)

## 2019-10-29 LAB — URINE CULTURE

## 2019-10-29 LAB — CARDIOVASCULAR RISK ASSESSMENT

## 2019-10-29 MED ORDER — FENOFIBRATE 160 MG PO TABS: 160.0000 mg | ORAL_TABLET | Freq: Every day | ORAL | 2 refills | Status: DC

## 2019-10-29 NOTE — Telephone Encounter (Signed)
PA for Nurtec submitted and approved via covermymeds from 10/29/2019-10/28/2020.

## 2019-10-31 ENCOUNTER — Encounter: Payer: Self-pay | Admitting: Family Medicine

## 2019-11-20 NOTE — Progress Notes (Signed)
Not seen

## 2019-11-22 ENCOUNTER — Ambulatory Visit (INDEPENDENT_AMBULATORY_CARE_PROVIDER_SITE_OTHER): Payer: Self-pay | Admitting: Family Medicine

## 2019-11-22 DIAGNOSIS — E782 Mixed hyperlipidemia: Secondary | ICD-10-CM

## 2019-11-27 DIAGNOSIS — H60331 Swimmer's ear, right ear: Secondary | ICD-10-CM | POA: Diagnosis not present

## 2019-12-03 DIAGNOSIS — H6990 Unspecified Eustachian tube disorder, unspecified ear: Secondary | ICD-10-CM | POA: Diagnosis not present

## 2019-12-03 DIAGNOSIS — J069 Acute upper respiratory infection, unspecified: Secondary | ICD-10-CM | POA: Diagnosis not present

## 2019-12-03 DIAGNOSIS — R05 Cough: Secondary | ICD-10-CM | POA: Diagnosis not present

## 2019-12-10 ENCOUNTER — Other Ambulatory Visit: Payer: Self-pay

## 2019-12-10 ENCOUNTER — Ambulatory Visit (INDEPENDENT_AMBULATORY_CARE_PROVIDER_SITE_OTHER): Payer: BLUE CROSS/BLUE SHIELD | Admitting: Family Medicine

## 2019-12-10 ENCOUNTER — Other Ambulatory Visit: Payer: Self-pay | Admitting: Family Medicine

## 2019-12-10 ENCOUNTER — Encounter: Payer: Self-pay | Admitting: Family Medicine

## 2019-12-10 VITALS — BP 118/80 | HR 109 | Temp 97.5°F | Ht 64.0 in | Wt 191.0 lb

## 2019-12-10 DIAGNOSIS — F330 Major depressive disorder, recurrent, mild: Secondary | ICD-10-CM | POA: Diagnosis not present

## 2019-12-10 DIAGNOSIS — G8929 Other chronic pain: Secondary | ICD-10-CM

## 2019-12-10 DIAGNOSIS — F5102 Adjustment insomnia: Secondary | ICD-10-CM | POA: Insufficient documentation

## 2019-12-10 DIAGNOSIS — M5440 Lumbago with sciatica, unspecified side: Secondary | ICD-10-CM | POA: Diagnosis not present

## 2019-12-10 DIAGNOSIS — G4700 Insomnia, unspecified: Secondary | ICD-10-CM | POA: Insufficient documentation

## 2019-12-10 MED ORDER — BELSOMRA 10 MG PO TABS: 1.0000 | ORAL_TABLET | Freq: Every evening | ORAL | 2 refills | Status: DC

## 2019-12-10 MED ORDER — OXYCODONE HCL 5 MG PO CAPS: 5.0000 mg | ORAL_CAPSULE | Freq: Three times a day (TID) | ORAL | 0 refills | Status: DC | PRN

## 2019-12-10 MED ORDER — BUSPIRONE HCL 5 MG PO TABS: 5.0000 mg | ORAL_TABLET | Freq: Three times a day (TID) | ORAL | 0 refills | Status: DC
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
## 2019-12-10 NOTE — Patient Instructions (Signed)
Belsomra 10 mg once daily at night.  Add buspirone 5 mg one three times a day.

## 2019-12-11 ENCOUNTER — Telehealth: Payer: Self-pay

## 2019-12-11 NOTE — Telephone Encounter (Signed)
PA for Belsomra submitted and approved via [email or web address]. Additional info will be faxed over.

## 2019-12-24 ENCOUNTER — Ambulatory Visit: Payer: Managed Care, Other (non HMO) | Admitting: Internal Medicine

## 2019-12-31 ENCOUNTER — Telehealth: Payer: Self-pay

## 2019-12-31 NOTE — Telephone Encounter (Signed)
Nancy Armstrong called to report that her belsomra is not covered by her insurance and she would like to try Ambien again.  Request sent to Dr. Tobie Poet.

## 2020-01-01 ENCOUNTER — Other Ambulatory Visit: Payer: Self-pay | Admitting: Family Medicine

## 2020-01-06 ENCOUNTER — Other Ambulatory Visit: Payer: Self-pay | Admitting: Physician Assistant

## 2020-01-06 MED ORDER — ZOLPIDEM TARTRATE 5 MG PO TABS: 5.0000 mg | ORAL_TABLET | Freq: Every evening | ORAL | 0 refills | Status: DC | PRN

## 2020-01-19 ENCOUNTER — Other Ambulatory Visit: Payer: Self-pay | Admitting: Internal Medicine

## 2020-01-20 NOTE — Telephone Encounter (Signed)
I left her a message on her mobile # to call me back. She cancelled her last appointment because we don't take her insurance. I need to find out if that has changed.

## 2020-01-21 NOTE — Telephone Encounter (Signed)
Left Jeanann another message to call me back.

## 2020-01-23 NOTE — Telephone Encounter (Signed)
Left her another message to call me back please.

## 2020-01-25 ENCOUNTER — Other Ambulatory Visit: Payer: Self-pay | Admitting: Family Medicine

## 2020-02-02 ENCOUNTER — Other Ambulatory Visit: Payer: Self-pay | Admitting: Physician Assistant

## 2020-02-19 ENCOUNTER — Ambulatory Visit (INDEPENDENT_AMBULATORY_CARE_PROVIDER_SITE_OTHER): Payer: BC Managed Care – PPO | Admitting: Orthopedic Surgery

## 2020-02-19 ENCOUNTER — Ambulatory Visit: Payer: Self-pay

## 2020-02-19 ENCOUNTER — Telehealth: Payer: Self-pay

## 2020-02-19 VITALS — Ht 64.0 in | Wt 198.0 lb

## 2020-02-19 DIAGNOSIS — M25561 Pain in right knee: Secondary | ICD-10-CM | POA: Diagnosis not present

## 2020-02-19 NOTE — Telephone Encounter (Signed)
Can we please get auth for right knee gel injection?

## 2020-02-20 ENCOUNTER — Other Ambulatory Visit: Payer: Self-pay

## 2020-02-20 MED ORDER — DULOXETINE HCL 60 MG PO CPEP: 60.0000 mg | ORAL_CAPSULE | Freq: Every day | ORAL | 1 refills | Status: DC

## 2020-02-20 NOTE — Telephone Encounter (Signed)
Submitted for VOB for Synvisc one-Right knee  °

## 2020-02-21 ENCOUNTER — Telehealth: Payer: Self-pay

## 2020-02-21 ENCOUNTER — Encounter: Payer: Self-pay | Admitting: Orthopedic Surgery
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
## 2020-02-21 NOTE — Telephone Encounter (Signed)
Pt's insurance requires PA for gel injection. This was completed and faxed today

## 2020-02-25 ENCOUNTER — Telehealth: Payer: Self-pay

## 2020-02-25 DIAGNOSIS — L209 Atopic dermatitis, unspecified: Secondary | ICD-10-CM | POA: Diagnosis not present

## 2020-02-25 DIAGNOSIS — L814 Other melanin hyperpigmentation: Secondary | ICD-10-CM | POA: Diagnosis not present

## 2020-02-25 DIAGNOSIS — D485 Neoplasm of uncertain behavior of skin: Secondary | ICD-10-CM | POA: Diagnosis not present

## 2020-02-25 DIAGNOSIS — D225 Melanocytic nevi of trunk: Secondary | ICD-10-CM | POA: Diagnosis not present

## 2020-02-25 DIAGNOSIS — L578 Other skin changes due to chronic exposure to nonionizing radiation: Secondary | ICD-10-CM | POA: Diagnosis not present

## 2020-02-25 DIAGNOSIS — L820 Inflamed seborrheic keratosis: Secondary | ICD-10-CM | POA: Diagnosis not present

## 2020-02-25 NOTE — Telephone Encounter (Signed)
Lvm for pt to call back to schedule °

## 2020-02-25 NOTE — Telephone Encounter (Signed)
Approved for Synvisc one-right knee Dr. Latanya Presser and Bill $40 copay then covered @ 100% Prior Auth required Auth # [facility] Dates 02/21/20-08/22/20   Ok to schedule @ next available

## 2020-02-27 NOTE — Telephone Encounter (Signed)
Called pt and scheduled

## 2020-03-02 ENCOUNTER — Other Ambulatory Visit: Payer: Self-pay | Admitting: Physician Assistant

## 2020-03-02 ENCOUNTER — Other Ambulatory Visit: Payer: Self-pay | Admitting: Family Medicine

## 2020-03-02 NOTE — Progress Notes (Signed)
ERRONEOUSLY CANCELLED ON SCHEDULE, BUT I CHANGED TO VIRTUAL DUE TO COVID 19 EXPOSURE.

## 2020-03-03 ENCOUNTER — Telehealth (INDEPENDENT_AMBULATORY_CARE_PROVIDER_SITE_OTHER): Payer: BLUE CROSS/BLUE SHIELD | Admitting: Family Medicine

## 2020-03-03 ENCOUNTER — Ambulatory Visit (INDEPENDENT_AMBULATORY_CARE_PROVIDER_SITE_OTHER): Payer: BLUE CROSS/BLUE SHIELD | Admitting: Family Medicine

## 2020-03-03 DIAGNOSIS — R05 Cough: Secondary | ICD-10-CM

## 2020-03-03 DIAGNOSIS — F4310 Post-traumatic stress disorder, unspecified: Secondary | ICD-10-CM

## 2020-03-03 DIAGNOSIS — K219 Gastro-esophageal reflux disease without esophagitis: Secondary | ICD-10-CM

## 2020-03-03 DIAGNOSIS — E782 Mixed hyperlipidemia: Secondary | ICD-10-CM

## 2020-03-03 DIAGNOSIS — Z20822 Contact with and (suspected) exposure to covid-19: Secondary | ICD-10-CM

## 2020-03-03 DIAGNOSIS — G43109 Migraine with aura, not intractable, without status migrainosus: Secondary | ICD-10-CM

## 2020-03-03 DIAGNOSIS — F411 Generalized anxiety disorder: Secondary | ICD-10-CM

## 2020-03-03 DIAGNOSIS — M797 Fibromyalgia: Secondary | ICD-10-CM | POA: Diagnosis not present

## 2020-03-03 DIAGNOSIS — R059 Cough, unspecified: Secondary | ICD-10-CM

## 2020-03-03 LAB — POC COVID19 BINAXNOW: SARS Coronavirus 2 Ag: NEGATIVE

## 2020-03-04 LAB — NOVEL CORONAVIRUS, NAA: SARS-CoV-2, NAA: NOT DETECTED

## 2020-03-04 LAB — SARS-COV-2, NAA 2 DAY TAT

## 2020-03-16 ENCOUNTER — Encounter: Payer: Self-pay | Admitting: Family Medicine
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
## 2020-03-18 ENCOUNTER — Ambulatory Visit: Payer: BLUE CROSS/BLUE SHIELD | Admitting: Orthopedic Surgery

## 2020-03-18 DIAGNOSIS — M1711 Unilateral primary osteoarthritis, right knee: Secondary | ICD-10-CM

## 2020-03-19 DIAGNOSIS — M5136 Other intervertebral disc degeneration, lumbar region: Secondary | ICD-10-CM | POA: Diagnosis not present

## 2020-03-21 ENCOUNTER — Encounter: Payer: Self-pay | Admitting: Orthopedic Surgery

## 2020-03-21 DIAGNOSIS — M1711 Unilateral primary osteoarthritis, right knee: Secondary | ICD-10-CM | POA: Diagnosis not present

## 2020-03-21 MED ORDER — LIDOCAINE HCL 1 % IJ SOLN
5.0000 mL | INTRAMUSCULAR | Status: AC | PRN
  Administered 2020-03-21: 5 mL

## 2020-03-21 MED ORDER — HYLAN G-F 20 48 MG/6ML IX SOSY
48.0000 mg | PREFILLED_SYRINGE | INTRA_ARTICULAR | Status: AC | PRN
  Administered 2020-03-21: 48 mg via INTRA_ARTICULAR

## 2020-03-21 NOTE — Progress Notes (Signed)
   Procedure Note  Patient: Nancy Armstrong             Date of Birth: Jan 03, 1971           MRN: [id number]             Visit Date: 03/18/2020  Procedures: Visit Diagnoses:  1. Unilateral primary osteoarthritis, right knee     Large Joint Inj: R knee on 03/21/2020 9:06 PM Indications: pain, joint swelling and diagnostic evaluation Details: 18 G 1.5 in needle, superolateral approach  Arthrogram: No  Medications: 5 mL lidocaine 1 %; 48 mg Hylan 48 MG/6ML Outcome: tolerated well, no immediate complications Procedure, treatment alternatives, risks and benefits explained, specific risks discussed. Consent was given by the patient. Immediately prior to procedure a time out was called to verify the correct patient, procedure, equipment, support staff and site/side marked as required. Patient was prepped and draped in the usual sterile fashion.

## 2020-03-30 ENCOUNTER — Telehealth: Payer: Self-pay

## 2020-03-30 NOTE — Telephone Encounter (Signed)
She had gel injection. Next step would be to try cortisone injection or pursue MRI scan of knee to see if anything is treatable by arthroscopy

## 2020-03-30 NOTE — Telephone Encounter (Signed)
Tried calling to discuss. No answer. Will try to reach patient again.

## 2020-03-30 NOTE — Telephone Encounter (Signed)
Please advise. Thanks.  

## 2020-03-30 NOTE — Telephone Encounter (Signed)
Patient called in advising that the injection isnt working , wanting to know next steps

## 2020-03-31 NOTE — Telephone Encounter (Signed)
I called, no answer.  ?

## 2020-04-09 ENCOUNTER — Telehealth (INDEPENDENT_AMBULATORY_CARE_PROVIDER_SITE_OTHER): Payer: BC Managed Care – PPO | Admitting: Family Medicine

## 2020-04-09 ENCOUNTER — Encounter: Payer: Self-pay | Admitting: Family Medicine

## 2020-04-09 VITALS — BP 135/89 | Temp 97.5°F | Ht 64.0 in | Wt 203.0 lb

## 2020-04-09 DIAGNOSIS — Z20822 Contact with and (suspected) exposure to covid-19: Secondary | ICD-10-CM | POA: Diagnosis not present

## 2020-04-09 DIAGNOSIS — J029 Acute pharyngitis, unspecified: Secondary | ICD-10-CM

## 2020-04-09 DIAGNOSIS — R059 Cough, unspecified: Secondary | ICD-10-CM

## 2020-04-09 DIAGNOSIS — R05 Cough: Secondary | ICD-10-CM

## 2020-04-09 LAB — POC COVID19 BINAXNOW: SARS Coronavirus 2 Ag: NEGATIVE

## 2020-04-09 LAB — POCT RAPID STREP A (OFFICE): Rapid Strep A Screen: NEGATIVE
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
## 2020-04-10 LAB — SARS-COV-2, NAA 2 DAY TAT

## 2020-04-10 LAB — NOVEL CORONAVIRUS, NAA: SARS-CoV-2, NAA: NOT DETECTED

## 2020-04-20 DIAGNOSIS — M5416 Radiculopathy, lumbar region: Secondary | ICD-10-CM | POA: Diagnosis not present

## 2020-04-22 ENCOUNTER — Other Ambulatory Visit: Payer: Self-pay | Admitting: Family Medicine

## 2020-04-23 ENCOUNTER — Other Ambulatory Visit: Payer: Self-pay | Admitting: Internal Medicine

## 2020-04-23 NOTE — Telephone Encounter (Signed)
Okay to refill x2 months I was expecting her to follow-up with me for further treatment  If she is doing well on her current regimen that is not necessary but she would need to get further refills through other physicians or come see me for more refills

## 2020-04-23 NOTE — Telephone Encounter (Signed)
Please advise Sir, thank you. 

## 2020-04-23 NOTE — Telephone Encounter (Signed)
Tried to call Nancy Armstrong and her voicemail is full, I'm unable to leave a message. I will send in a refill with note attached.

## 2020-04-27 NOTE — Telephone Encounter (Signed)
Tried to reach [place] again and voicemail still full, unable to leave her a message.

## 2020-04-29 ENCOUNTER — Ambulatory Visit (INDEPENDENT_AMBULATORY_CARE_PROVIDER_SITE_OTHER): Payer: BC Managed Care – PPO | Admitting: Family Medicine

## 2020-04-29 ENCOUNTER — Other Ambulatory Visit: Payer: Self-pay

## 2020-04-29 ENCOUNTER — Ambulatory Visit: Payer: BC Managed Care – PPO | Admitting: Family Medicine

## 2020-04-29 VITALS — BP 122/62 | HR 88 | Temp 97.6°F | Resp 16 | Ht 64.0 in | Wt 202.6 lb

## 2020-04-29 DIAGNOSIS — M79604 Pain in right leg: Secondary | ICD-10-CM | POA: Diagnosis not present
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
## 2020-05-06 ENCOUNTER — Ambulatory Visit (INDEPENDENT_AMBULATORY_CARE_PROVIDER_SITE_OTHER): Payer: BC Managed Care – PPO | Admitting: Orthopedic Surgery

## 2020-05-06 ENCOUNTER — Encounter: Payer: Self-pay | Admitting: Orthopedic Surgery

## 2020-05-06 DIAGNOSIS — M25561 Pain in right knee: Secondary | ICD-10-CM | POA: Diagnosis not present

## 2020-05-06 MED ORDER — TRAMADOL HCL 50 MG PO TABS
50.0000 mg | ORAL_TABLET | Freq: Three times a day (TID) | ORAL | 0 refills | Status: DC | PRN
Start: 2020-05-06 — End: 2020-07-01
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
## 2020-05-12 DIAGNOSIS — M5416 Radiculopathy, lumbar region: Secondary | ICD-10-CM | POA: Diagnosis not present

## 2020-05-12 DIAGNOSIS — M542 Cervicalgia: Secondary | ICD-10-CM | POA: Diagnosis not present

## 2020-05-12 DIAGNOSIS — M5136 Other intervertebral disc degeneration, lumbar region: Secondary | ICD-10-CM | POA: Diagnosis not present

## 2020-05-13 ENCOUNTER — Other Ambulatory Visit: Payer: Self-pay | Admitting: Legal Medicine

## 2020-05-13 ENCOUNTER — Encounter: Payer: Self-pay | Admitting: Family Medicine

## 2020-05-14 ENCOUNTER — Encounter: Payer: Self-pay | Admitting: Orthopedic Surgery

## 2020-05-18 DIAGNOSIS — M1711 Unilateral primary osteoarthritis, right knee: Secondary | ICD-10-CM | POA: Diagnosis not present

## 2020-05-18 DIAGNOSIS — M25561 Pain in right knee: Secondary | ICD-10-CM | POA: Diagnosis not present

## 2020-05-21 ENCOUNTER — Other Ambulatory Visit: Payer: Self-pay | Admitting: Internal Medicine

## 2020-05-23 ENCOUNTER — Other Ambulatory Visit: Payer: Self-pay | Admitting: Family Medicine

## 2020-05-26 ENCOUNTER — Telehealth: Payer: Self-pay

## 2020-05-26 NOTE — Telephone Encounter (Signed)
Patient left message stating she was having a tingling sensation around her right eye and was curious if it was related to her fibromyalgia or if she needed a appt. Attempted to contact patient, left message advising patient that if she had any concerns she can contact our office to schedule a appt.

## 2020-05-27 ENCOUNTER — Encounter: Payer: Self-pay | Admitting: Family Medicine

## 2020-05-27 ENCOUNTER — Ambulatory Visit (INDEPENDENT_AMBULATORY_CARE_PROVIDER_SITE_OTHER): Payer: BC Managed Care – PPO | Admitting: Orthopedic Surgery

## 2020-05-27 ENCOUNTER — Other Ambulatory Visit: Payer: Self-pay

## 2020-05-27 ENCOUNTER — Encounter: Payer: Self-pay | Admitting: Orthopedic Surgery

## 2020-05-27 ENCOUNTER — Ambulatory Visit (INDEPENDENT_AMBULATORY_CARE_PROVIDER_SITE_OTHER): Payer: BC Managed Care – PPO | Admitting: Family Medicine

## 2020-05-27 VITALS — Ht 64.0 in | Wt 200.0 lb

## 2020-05-27 VITALS — BP 118/78 | HR 92 | Temp 97.5°F | Ht 64.0 in | Wt 200.8 lb

## 2020-05-27 DIAGNOSIS — S83281D Other tear of lateral meniscus, current injury, right knee, subsequent encounter: Secondary | ICD-10-CM

## 2020-05-27 DIAGNOSIS — H5711 Ocular pain, right eye: Secondary | ICD-10-CM | POA: Insufficient documentation

## 2020-05-27 DIAGNOSIS — H1011 Acute atopic conjunctivitis, right eye: Secondary | ICD-10-CM | POA: Diagnosis not present
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
## 2020-05-28 LAB — CBC WITH DIFFERENTIAL/PLATELET
Basophils Absolute: 0.1 10*3/uL (ref 0.0–0.2)
Basos: 1 %
EOS (ABSOLUTE): 0.2 10*3/uL (ref 0.0–0.4)
Eos: 3 %
Hematocrit: 41.3 % (ref 34.0–46.6)
Hemoglobin: 13.2 g/dL (ref 11.1–15.9)
Immature Grans (Abs): 0 10*3/uL (ref 0.0–0.1)
Immature Granulocytes: 0 %
Lymphocytes Absolute: 1.6 10*3/uL (ref 0.7–3.1)
Lymphs: 24 %
MCH: 27.8 pg (ref 26.6–33.0)
MCHC: 32 g/dL (ref 31.5–35.7)
MCV: 87 fL (ref 79–97)
Monocytes Absolute: 0.6 10*3/uL (ref 0.1–0.9)
Monocytes: 9 %
Neutrophils Absolute: 4.1 10*3/uL (ref 1.4–7.0)
Neutrophils: 63 %
Platelets: 408 10*3/uL (ref 150–450)
RBC: 4.74 x10E6/uL (ref 3.77–5.28)
RDW: 13.8 % (ref 11.7–15.4)
WBC: 6.6 10*3/uL (ref 3.4–10.8)

## 2020-05-28 LAB — SEDIMENTATION RATE: Sed Rate: 19 mm/hr (ref 0–32)

## 2020-05-28 LAB — CMP14+EGFR
ALT: 18 IU/L (ref 0–32)
AST: 15 IU/L (ref 0–40)
Albumin/Globulin Ratio: 1.6 (ref 1.2–2.2)
Albumin: 4.4 g/dL (ref 3.8–4.8)
Alkaline Phosphatase: 84 IU/L (ref 44–121)
BUN/Creatinine Ratio: 16 (ref 9–23)
BUN: 16 mg/dL (ref 6–24)
Bilirubin Total: <0.2 mg/dL (ref 0.0–1.2)
CO2: 25 mmol/L (ref 20–29)
Calcium: 10 mg/dL (ref 8.7–10.2)
Chloride: 102 mmol/L (ref 96–106)
Creatinine, Ser: 1.03 mg/dL — ABNORMAL HIGH (ref 0.57–1.00)
GFR calc Af Amer: 74 mL/min/{1.73_m2} (ref >59)
GFR calc non Af Amer: 64 mL/min/{1.73_m2} (ref >59)
Globulin, Total: 2.7 g/dL (ref 1.5–4.5)
Glucose: 98 mg/dL (ref 65–99)
Potassium: 4.9 mmol/L (ref 3.5–5.2)
Sodium: 141 mmol/L (ref 134–144)
Total Protein: 7.1 g/dL (ref 6.0–8.5)

## 2020-05-30 ENCOUNTER — Encounter: Payer: Self-pay | Admitting: Orthopedic Surgery
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
## 2020-06-03 ENCOUNTER — Telehealth: Payer: Self-pay

## 2020-06-03 NOTE — Telephone Encounter (Signed)
Pt called states she has had a headache and pain from shingles for about a week. The pain has caused her to stay home from work. She asked if the pain was normal, pt also stated she has fibromyalgia. CMA let her know it is normal to have some pain but would send a message to provider and get more recommendations if possible. Please advise.

## 2020-06-08 ENCOUNTER — Telehealth: Payer: Self-pay

## 2020-06-08 NOTE — Telephone Encounter (Signed)
Patient called in about restrictions for after surgery on  Monday.

## 2020-06-08 NOTE — Telephone Encounter (Signed)
IC s/w patient and advised. Verbalized understanding.  

## 2020-06-08 NOTE — Telephone Encounter (Signed)
Patient scheduled for surgery next Monday. Will you please advise what her restrictions will be post operatively.

## 2020-06-08 NOTE — Telephone Encounter (Signed)
Partial weightbearing with crutches for for 5 days then she should be able to get off the crutches by the weekend.  Weightbearing as tolerated thereafter.

## 2020-06-08 NOTE — Telephone Encounter (Signed)
Shingles is extremely painful.  The pain can last long after the lesions have disappeared. If lesions are close to the eye, the patient must return to her eye doctor for ongoing evaluation

## 2020-06-09 DIAGNOSIS — H1011 Acute atopic conjunctivitis, right eye: Secondary | ICD-10-CM | POA: Diagnosis not present

## 2020-06-10 HISTORY — PX: KNEE SURGERY: SHX244

## 2020-06-18 ENCOUNTER — Other Ambulatory Visit: Payer: Self-pay

## 2020-06-18 ENCOUNTER — Other Ambulatory Visit: Payer: Self-pay | Admitting: Internal Medicine

## 2020-06-19 NOTE — Telephone Encounter (Signed)
Please advise Sir, thank you. 

## 2020-06-19 NOTE — Telephone Encounter (Signed)
Needs to have appointment on books by feb and will then fill to make it the appointment

## 2020-06-22 ENCOUNTER — Inpatient Hospital Stay: Payer: BC Managed Care – PPO | Admitting: Orthopedic Surgery

## 2020-06-22 NOTE — Telephone Encounter (Signed)
Left her a detailed message on her cell # to call back and set up appointment so we can refill her rx.

## 2020-06-24 DIAGNOSIS — M62838 Other muscle spasm: Secondary | ICD-10-CM | POA: Diagnosis not present

## 2020-06-24 NOTE — Telephone Encounter (Signed)
Spoke with Peggi and she has a 08/11/2020 appointment. Rx refilled.

## 2020-06-25 ENCOUNTER — Encounter: Payer: Self-pay | Admitting: Family Medicine

## 2020-06-27 ENCOUNTER — Other Ambulatory Visit (HOSPITAL_COMMUNITY)
Admission: RE | Admit: 2020-06-27 | Discharge: 2020-06-27 | Disposition: A | Discharge status: Home / Self Care | Payer: BC Managed Care – PPO | Source: Ambulatory Visit | Attending: Orthopedic Surgery | Admitting: Orthopedic Surgery

## 2020-06-27 DIAGNOSIS — Z01812 Encounter for preprocedural laboratory examination: Secondary | ICD-10-CM | POA: Insufficient documentation

## 2020-06-27 DIAGNOSIS — Z20822 Contact with and (suspected) exposure to covid-19: Secondary | ICD-10-CM | POA: Insufficient documentation

## 2020-06-27 LAB — SARS CORONAVIRUS 2 (TAT 6-24 HRS): SARS Coronavirus 2: NEGATIVE

## 2020-06-30 ENCOUNTER — Encounter (HOSPITAL_COMMUNITY): Payer: Self-pay | Admitting: Orthopedic Surgery

## 2020-06-30 NOTE — Progress Notes (Signed)
Spoke with pt for pre-op call. Pt denies cardiac history, HTN or diabetes.   Covid test done 06/27/20 and it's negative.  Pt states she's been in quarantine since the test was done and understands that she stays in quarantine until she comes to the hospital tomorrow.

## 2020-07-01 ENCOUNTER — Ambulatory Visit (HOSPITAL_COMMUNITY): Payer: BC Managed Care – PPO | Admitting: Certified Registered"

## 2020-07-01 ENCOUNTER — Encounter (HOSPITAL_COMMUNITY): Payer: Self-pay | Admitting: Orthopedic Surgery

## 2020-07-01 ENCOUNTER — Encounter: Payer: Self-pay | Admitting: Orthopedic Surgery

## 2020-07-01 ENCOUNTER — Other Ambulatory Visit: Payer: Self-pay

## 2020-07-01 ENCOUNTER — Ambulatory Visit (HOSPITAL_COMMUNITY)
Admission: RE | Admit: 2020-07-01 | Discharge: 2020-07-01 | Disposition: A | Discharge status: Home / Self Care | Payer: BC Managed Care – PPO | Attending: Orthopedic Surgery | Admitting: Orthopedic Surgery

## 2020-07-01 ENCOUNTER — Encounter (HOSPITAL_COMMUNITY)
Admission: RE | Disposition: A | Discharge status: Home / Self Care | Payer: Self-pay | Source: Home / Self Care | Attending: Orthopedic Surgery

## 2020-07-01 DIAGNOSIS — M2341 Loose body in knee, right knee: Secondary | ICD-10-CM | POA: Insufficient documentation

## 2020-07-01 DIAGNOSIS — Z79899 Other long term (current) drug therapy: Secondary | ICD-10-CM | POA: Insufficient documentation

## 2020-07-01 DIAGNOSIS — M23206 Derangement of unspecified meniscus due to old tear or injury, right knee: Secondary | ICD-10-CM | POA: Insufficient documentation

## 2020-07-01 DIAGNOSIS — Z888 Allergy status to other drugs, medicaments and biological substances status: Secondary | ICD-10-CM | POA: Insufficient documentation

## 2020-07-01 DIAGNOSIS — S83241A Other tear of medial meniscus, current injury, right knee, initial encounter: Secondary | ICD-10-CM | POA: Diagnosis not present

## 2020-07-01 DIAGNOSIS — M23200 Derangement of unspecified lateral meniscus due to old tear or injury, right knee: Secondary | ICD-10-CM | POA: Diagnosis not present

## 2020-07-01 DIAGNOSIS — Z8379 Family history of other diseases of the digestive system: Secondary | ICD-10-CM | POA: Diagnosis not present

## 2020-07-01 DIAGNOSIS — M23221 Derangement of posterior horn of medial meniscus due to old tear or injury, right knee: Secondary | ICD-10-CM | POA: Insufficient documentation

## 2020-07-01 DIAGNOSIS — S83241D Other tear of medial meniscus, current injury, right knee, subsequent encounter: Secondary | ICD-10-CM

## 2020-07-01 DIAGNOSIS — M94261 Chondromalacia, right knee: Secondary | ICD-10-CM | POA: Insufficient documentation

## 2020-07-01 DIAGNOSIS — Z833 Family history of diabetes mellitus: Secondary | ICD-10-CM | POA: Insufficient documentation

## 2020-07-01 DIAGNOSIS — Z881 Allergy status to other antibiotic agents status: Secondary | ICD-10-CM | POA: Diagnosis not present

## 2020-07-01 DIAGNOSIS — Z8249 Family history of ischemic heart disease and other diseases of the circulatory system: Secondary | ICD-10-CM | POA: Diagnosis not present

## 2020-07-01 DIAGNOSIS — K219 Gastro-esophageal reflux disease without esophagitis: Secondary | ICD-10-CM | POA: Diagnosis not present

## 2020-07-01 DIAGNOSIS — Z800 Family history of malignant neoplasm of digestive organs: Secondary | ICD-10-CM | POA: Insufficient documentation

## 2020-07-01 DIAGNOSIS — Z801 Family history of malignant neoplasm of trachea, bronchus and lung: Secondary | ICD-10-CM | POA: Diagnosis not present

## 2020-07-01 DIAGNOSIS — E782 Mixed hyperlipidemia: Secondary | ICD-10-CM | POA: Diagnosis not present

## 2020-07-01 DIAGNOSIS — S83281A Other tear of lateral meniscus, current injury, right knee, initial encounter: Secondary | ICD-10-CM | POA: Diagnosis not present

## 2020-07-01 DIAGNOSIS — M1711 Unilateral primary osteoarthritis, right knee: Secondary | ICD-10-CM | POA: Diagnosis not present

## 2020-07-01 HISTORY — PX: KNEE ARTHROSCOPY: SHX127

## 2020-07-01 HISTORY — DX: Personal history of urinary calculi: Z87.442

## 2020-07-01 LAB — CBC
HCT: 39.1 % (ref 36.0–46.0)
Hemoglobin: 12.5 g/dL (ref 12.0–15.0)
MCH: 28.7 pg (ref 26.0–34.0)
MCHC: 32 g/dL (ref 30.0–36.0)
MCV: 89.7 fL (ref 80.0–100.0)
Platelets: 341 10*3/uL (ref 150–400)
RBC: 4.36 MIL/uL (ref 3.87–5.11)
RDW: 14.3 % (ref 11.5–15.5)
WBC: 6.6 10*3/uL (ref 4.0–10.5)
nRBC: 0 % (ref 0.0–0.2)

## 2020-07-01 LAB — BASIC METABOLIC PANEL
Anion gap: 11 (ref 5–15)
BUN: 10 mg/dL (ref 6–20)
CO2: 24 mmol/L (ref 22–32)
Calcium: 9 mg/dL (ref 8.9–10.3)
Chloride: 104 mmol/L (ref 98–111)
Creatinine, Ser: 0.76 mg/dL (ref 0.44–1.00)
GFR, Estimated: >60 mL/min (ref >60)
Glucose, Bld: 101 mg/dL — ABNORMAL HIGH (ref 70–99)
Potassium: 3.6 mmol/L (ref 3.5–5.1)
Sodium: 139 mmol/L (ref 135–145)

## 2020-07-01 SURGERY — ARTHROSCOPY, KNEE
Anesthesia: General | Site: Knee | Laterality: Right

## 2020-07-01 MED ORDER — GLYCOPYRROLATE PF 0.2 MG/ML IJ SOSY
PREFILLED_SYRINGE | INTRAMUSCULAR | Status: AC
  Filled 2020-07-01: qty 1

## 2020-07-01 MED ORDER — ARTIFICIAL TEARS OPHTHALMIC OINT
TOPICAL_OINTMENT | OPHTHALMIC | Status: AC
  Filled 2020-07-01: qty 3.5

## 2020-07-01 MED ORDER — PROPOFOL 10 MG/ML IV BOLUS
INTRAVENOUS | Status: DC | PRN
Start: 2020-07-01 — End: 2020-07-01
  Administered 2020-07-01: 170 mg via INTRAVENOUS

## 2020-07-01 MED ORDER — SODIUM CHLORIDE 0.9 % IR SOLN
Status: DC | PRN
  Administered 2020-07-01: 6000 mL

## 2020-07-01 MED ORDER — 0.9 % SODIUM CHLORIDE (POUR BTL) OPTIME
TOPICAL | Status: DC | PRN
  Administered 2020-07-01: 1000 mL

## 2020-07-01 MED ORDER — ONDANSETRON HCL 4 MG/2ML IJ SOLN
INTRAMUSCULAR | Status: DC | PRN
  Administered 2020-07-01: 4 mg via INTRAVENOUS

## 2020-07-01 MED ORDER — LACTATED RINGERS IV SOLN: INTRAVENOUS | Status: DC

## 2020-07-01 MED ORDER — EPINEPHRINE PF 1 MG/ML IJ SOLN
INTRAMUSCULAR | Status: DC | PRN
  Administered 2020-07-01: 2 mL
  Administered 2020-07-01: .15 mL

## 2020-07-01 MED ORDER — BUPIVACAINE HCL (PF) 0.25 % IJ SOLN
INTRAMUSCULAR | Status: AC
  Filled 2020-07-01: qty 30

## 2020-07-01 MED ORDER — DEXAMETHASONE SODIUM PHOSPHATE 10 MG/ML IJ SOLN
INTRAMUSCULAR | Status: AC
  Filled 2020-07-01: qty 2

## 2020-07-01 MED ORDER — MORPHINE SULFATE (PF) 4 MG/ML IV SOLN
INTRAVENOUS | Status: AC
  Filled 2020-07-01: qty 2

## 2020-07-01 MED ORDER — CHLORHEXIDINE GLUCONATE 0.12 % MT SOLN
15.0000 mL | Freq: Once | OROMUCOSAL | Status: AC
  Administered 2020-07-01: 15 mL via OROMUCOSAL
  Filled 2020-07-01: qty 15

## 2020-07-01 MED ORDER — CEFAZOLIN SODIUM-DEXTROSE 2-4 GM/100ML-% IV SOLN
2.0000 g | INTRAVENOUS | Status: AC
  Administered 2020-07-01: 2 g via INTRAVENOUS
  Filled 2020-07-01: qty 100

## 2020-07-01 MED ORDER — POVIDONE-IODINE 10 % EX SWAB: 2.0000 "application " | Freq: Once | CUTANEOUS | Status: DC

## 2020-07-01 MED ORDER — BUPIVACAINE HCL (PF) 0.25 % IJ SOLN
INTRAMUSCULAR | Status: DC | PRN
  Administered 2020-07-01: 30 mL

## 2020-07-01 MED ORDER — HYDROMORPHONE HCL 1 MG/ML IJ SOLN
0.2500 mg | INTRAMUSCULAR | Status: DC | PRN
  Administered 2020-07-01: 0.25 mg via INTRAVENOUS

## 2020-07-01 MED ORDER — DEXAMETHASONE SODIUM PHOSPHATE 10 MG/ML IJ SOLN
INTRAMUSCULAR | Status: DC | PRN
  Administered 2020-07-01: 10 mg via INTRAVENOUS

## 2020-07-01 MED ORDER — MORPHINE SULFATE 4 MG/ML IJ SOLN
INTRAMUSCULAR | Status: DC | PRN
  Administered 2020-07-01: 8 mg

## 2020-07-01 MED ORDER — CLONIDINE HCL (ANALGESIA) 100 MCG/ML EP SOLN
EPIDURAL | Status: AC
  Filled 2020-07-01: qty 10

## 2020-07-01 MED ORDER — ORAL CARE MOUTH RINSE: 15.0000 mL | Freq: Once | OROMUCOSAL | Status: AC

## 2020-07-01 MED ORDER — BUPIVACAINE-EPINEPHRINE 0.25% -1:200000 IJ SOLN
INTRAMUSCULAR | Status: DC | PRN
  Administered 2020-07-01: 10 mL

## 2020-07-01 MED ORDER — FENTANYL CITRATE (PF) 250 MCG/5ML IJ SOLN
INTRAMUSCULAR | Status: DC | PRN
  Administered 2020-07-01 (×2): 25 ug via INTRAVENOUS
  Administered 2020-07-01: 50 ug via INTRAVENOUS

## 2020-07-01 MED ORDER — OXYCODONE-ACETAMINOPHEN 5-325 MG PO TABS: 1.0000 | ORAL_TABLET | ORAL | 0 refills | Status: DC | PRN

## 2020-07-01 MED ORDER — POVIDONE-IODINE 7.5 % EX SOLN: Freq: Once | CUTANEOUS | Status: DC

## 2020-07-01 MED ORDER — ACETAMINOPHEN 10 MG/ML IV SOLN
INTRAVENOUS | Status: AC
  Filled 2020-07-01: qty 100

## 2020-07-01 MED ORDER — ASPIRIN 81 MG PO CHEW: 81.0000 mg | CHEWABLE_TABLET | Freq: Every day | ORAL | 0 refills | Status: DC

## 2020-07-01 MED ORDER — HYDROMORPHONE HCL 1 MG/ML IJ SOLN
INTRAMUSCULAR | Status: AC
  Administered 2020-07-01: 0.5 mg via INTRAVENOUS
  Filled 2020-07-01: qty 1

## 2020-07-01 MED ORDER — ONDANSETRON HCL 4 MG/2ML IJ SOLN
INTRAMUSCULAR | Status: AC
  Filled 2020-07-01: qty 4

## 2020-07-01 MED ORDER — LIDOCAINE 2% (20 MG/ML) 5 ML SYRINGE
INTRAMUSCULAR | Status: DC | PRN
  Administered 2020-07-01: 40 mg via INTRAVENOUS

## 2020-07-01 MED ORDER — ACETAMINOPHEN 10 MG/ML IV SOLN
INTRAVENOUS | Status: DC | PRN
  Administered 2020-07-01: 1000 mg via INTRAVENOUS

## 2020-07-01 MED ORDER — ONDANSETRON HCL 4 MG/2ML IJ SOLN: 4.0000 mg | Freq: Once | INTRAMUSCULAR | Status: DC | PRN

## 2020-07-01 MED ORDER — FENTANYL CITRATE (PF) 250 MCG/5ML IJ SOLN
INTRAMUSCULAR | Status: AC
  Filled 2020-07-01: qty 5

## 2020-07-01 MED ORDER — LIDOCAINE 2% (20 MG/ML) 5 ML SYRINGE
INTRAMUSCULAR | Status: AC
  Filled 2020-07-01: qty 10

## 2020-07-01 MED ORDER — EPINEPHRINE PF 1 MG/ML IJ SOLN
INTRAMUSCULAR | Status: AC
  Filled 2020-07-01: qty 2

## 2020-07-01 MED ORDER — MIDAZOLAM HCL 2 MG/2ML IJ SOLN
INTRAMUSCULAR | Status: AC
  Filled 2020-07-01: qty 2

## 2020-07-01 MED ORDER — CLONIDINE HCL (ANALGESIA) 100 MCG/ML EP SOLN
EPIDURAL | Status: DC | PRN
  Administered 2020-07-01: 1 mL

## 2020-07-01 MED ORDER — MEPERIDINE HCL 25 MG/ML IJ SOLN: 6.2500 mg | INTRAMUSCULAR | Status: DC | PRN

## 2020-07-01 MED ORDER — EPINEPHRINE PF 1 MG/ML IJ SOLN
INTRAMUSCULAR | Status: AC
  Filled 2020-07-01: qty 1

## 2020-07-01 MED ORDER — MIDAZOLAM HCL 5 MG/5ML IJ SOLN
INTRAMUSCULAR | Status: DC | PRN
  Administered 2020-07-01: 2 mg via INTRAVENOUS

## 2020-07-01 SURGICAL SUPPLY — 47 items
BANDAGE ESMARK 6X9 LF (GAUZE/BANDAGES/DRESSINGS) IMPLANT
BLADE CLIPPER SURG (BLADE) IMPLANT
BLADE EXCALIBUR 4.0X13 (MISCELLANEOUS) ×2 IMPLANT
BNDG CMPR 9X6 STRL LF SNTH (GAUZE/BANDAGES/DRESSINGS)
BNDG CMPR MED 10X6 ELC LF (GAUZE/BANDAGES/DRESSINGS) ×1
BNDG ELASTIC 6X10 VLCR STRL LF (GAUZE/BANDAGES/DRESSINGS) ×1 IMPLANT
BNDG ESMARK 6X9 LF (GAUZE/BANDAGES/DRESSINGS)
COVER SURGICAL LIGHT HANDLE (MISCELLANEOUS) ×2 IMPLANT
COVER WAND RF STERILE (DRAPES) ×2 IMPLANT
CUFF TOURN SGL QUICK 34 (TOURNIQUET CUFF) ×2
CUFF TRNQT CYL 34X4.125X (TOURNIQUET CUFF) IMPLANT
DRAPE ARTHROSCOPY W/POUCH 114 (DRAPES) ×2 IMPLANT
DRAPE U-SHAPE 47X51 STRL (DRAPES) ×2 IMPLANT
DRSG TEGADERM 4X4.75 (GAUZE/BANDAGES/DRESSINGS) ×4 IMPLANT
DURAPREP 26ML APPLICATOR (WOUND CARE) ×2 IMPLANT
DW OUTFLOW CASSETTE/TUBE SET (MISCELLANEOUS) ×2 IMPLANT
GAUZE SPONGE 4X4 12PLY STRL (GAUZE/BANDAGES/DRESSINGS) ×1 IMPLANT
GAUZE XEROFORM 1X8 LF (GAUZE/BANDAGES/DRESSINGS) ×1 IMPLANT
GLOVE BIOGEL PI IND STRL 8 (GLOVE) ×1 IMPLANT
GLOVE BIOGEL PI INDICATOR 8 (GLOVE) ×1
GLOVE ECLIPSE 8.0 STRL XLNG CF (GLOVE) ×2 IMPLANT
GOWN STRL REUS W/ TWL LRG LVL3 (GOWN DISPOSABLE) ×2 IMPLANT
GOWN STRL REUS W/ TWL XL LVL3 (GOWN DISPOSABLE) ×1 IMPLANT
GOWN STRL REUS W/TWL LRG LVL3 (GOWN DISPOSABLE) ×4
GOWN STRL REUS W/TWL XL LVL3 (GOWN DISPOSABLE) ×2
KIT BASIN OR (CUSTOM PROCEDURE TRAY) ×2 IMPLANT
KIT TURNOVER KIT B (KITS) ×2 IMPLANT
MANIFOLD NEPTUNE II (INSTRUMENTS) ×1 IMPLANT
NDL 18GX1X1/2 (RX/OR ONLY) (NEEDLE) IMPLANT
NDL HYPO 25GX1X1/2 BEV (NEEDLE) ×1 IMPLANT
NEEDLE 18GX1X1/2 (RX/OR ONLY) (NEEDLE) ×2 IMPLANT
NEEDLE HYPO 25GX1X1/2 BEV (NEEDLE) ×2 IMPLANT
NS IRRIG 1000ML POUR BTL (IV SOLUTION) ×1 IMPLANT
PACK ARTHROSCOPY DSU (CUSTOM PROCEDURE TRAY) ×2 IMPLANT
PAD ARMBOARD 7.5X6 YLW CONV (MISCELLANEOUS) ×4 IMPLANT
PADDING CAST COTTON 6X4 STRL (CAST SUPPLIES) ×2 IMPLANT
PORT APPOLLO RF 90DEGREE MULTI (SURGICAL WAND) IMPLANT
SPONGE LAP 4X18 RFD (DISPOSABLE) ×2 IMPLANT
SUT ETHILON 3 0 PS 1 (SUTURE) ×2 IMPLANT
SYR 20ML ECCENTRIC (SYRINGE) ×2 IMPLANT
SYR CONTROL 10ML LL (SYRINGE) ×1 IMPLANT
SYR TB 1ML LUER SLIP (SYRINGE) ×3 IMPLANT
TOWEL GREEN STERILE (TOWEL DISPOSABLE) ×2 IMPLANT
TOWEL GREEN STERILE FF (TOWEL DISPOSABLE) ×2 IMPLANT
TUBE CONNECTING 12X1/4 (SUCTIONS) ×2 IMPLANT
TUBING ARTHROSCOPY IRRIG 16FT (MISCELLANEOUS) ×2 IMPLANT
WATER STERILE IRR 1000ML POUR (IV SOLUTION) ×2 IMPLANT

## 2020-07-01 NOTE — Anesthesia Postprocedure Evaluation (Signed)
Anesthesia Post Note  Patient: Nancy Armstrong  Procedure(s) Performed: right knee arthroscopy, debridement (Right Knee)     Patient location during evaluation: PACU Anesthesia Type: General Level of consciousness: awake and alert Pain management: pain level controlled Vital Signs Assessment: post-procedure vital signs reviewed and stable Respiratory status: spontaneous breathing, nonlabored ventilation, respiratory function stable and patient connected to nasal cannula oxygen Cardiovascular status: blood pressure returned to baseline and stable Postop Assessment: no apparent nausea or vomiting Anesthetic complications: no   No complications documented.  Last Vitals:  Vitals:   07/01/20 1145 07/01/20 1200  BP: 110/73 120/81  Pulse: 88 81  Resp: (!) 9 20  Temp:  37.1 C  SpO2: 96% 98%    Last Pain:  Vitals:   07/01/20 1200  TempSrc:   PainSc: 3                  [name] DAVID

## 2020-07-01 NOTE — Anesthesia Preprocedure Evaluation (Signed)
Anesthesia Evaluation  Patient identified by MRN, date of birth, ID band Patient awake    Reviewed: Allergy & Precautions, NPO status , Patient's Chart, lab work & pertinent test results  Airway Mallampati: I  TM Distance: >3 FB Neck ROM: Full    Dental   Pulmonary    Pulmonary exam normal        Cardiovascular Normal cardiovascular exam     Neuro/Psych  Headaches, Anxiety Depression    GI/Hepatic GERD  Medicated and Controlled,  Endo/Other    Renal/GU      Musculoskeletal   Abdominal   Peds  Hematology   Anesthesia Other Findings   Reproductive/Obstetrics                             Anesthesia Physical Anesthesia Plan  ASA: II  Anesthesia Plan: General   Post-op Pain Management:    Induction: Intravenous  PONV Risk Score and Plan: 3 and Ondansetron, Midazolam and Treatment may vary due to age or medical condition  Airway Management Planned: LMA  Additional Equipment:   Intra-op Plan:   Post-operative Plan: Extubation in OR  Informed Consent: I have reviewed the patients History and Physical, chart, labs and discussed the procedure including the risks, benefits and alternatives for the proposed anesthesia with the patient or authorized representative who has indicated his/her understanding and acceptance.       Plan Discussed with: CRNA and Surgeon  Anesthesia Plan Comments:         Anesthesia Quick Evaluation
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
## 2020-07-01 NOTE — Anesthesia Procedure Notes (Signed)
Procedure Name: LMA Insertion Date/Time: 07/01/2020 10:16 AM Performed by: Cleda Daub, CRNA Pre-anesthesia Checklist: Patient identified, Emergency Drugs available, Suction available and Patient being monitored Patient Re-evaluated:Patient Re-evaluated prior to induction Oxygen Delivery Method: Circle system utilized Preoxygenation: Pre-oxygenation with 100% oxygen Induction Type: IV induction LMA: LMA inserted LMA Size: 4.0 Placement Confirmation: positive ETCO2 Tube secured with: Tape Dental Injury: Teeth and Oropharynx as per pre-operative assessment

## 2020-07-01 NOTE — Brief Op Note (Signed)
   07/01/2020  11:11 AM  PATIENT:  [name]  49 y.o. female  PRE-OPERATIVE DIAGNOSIS:  right knee lateral/medial meniscal tear  POST-OPERATIVE DIAGNOSIS:  right knee lateral/medial meniscal tear  PROCEDURE:  Procedure(s): right knee arthroscopy, debridement medial and lateral meniscal tear  SURGEON:  Surgeon(s): Meredith Pel, MD  ASSISTANT: magnant pa  ANESTHESIA:   general  EBL: 2 ml    Total I/O In: 1100 [I.V.:900; IV Piggyback:200] Out: -   BLOOD ADMINISTERED: none  DRAINS: none   LOCAL MEDICATIONS USED: marcaine mso4 clonidine  SPECIMEN:  No Specimen  COUNTS:  YES  TOURNIQUET:  * Missing tourniquet times found for documented tourniquets in log: [id number] *  DICTATION: .Other Dictation: Dictation Number [id number]  PLAN OF CARE: Discharge to home after PACU  PATIENT DISPOSITION:  PACU - hemodynamically stable

## 2020-07-01 NOTE — OR Nursing (Addendum)
Pt is awake,alert and oriented. Pt is in NAD at this time. Pt and family verbalized understanding of poc and discharge instructions. instructions given to family and reviewed prior to discharge.Pt is ambulatory to wheelchair with stand by assist but not needed with steady gait.  Pt taken to front lobby and placed in car with family.   

## 2020-07-01 NOTE — Op Note (Signed)
NAME: Nancy Armstrong, Nancy Armstrong MEDICAL RECORD [id number] ACCOUNT [id number] DATE OF BIRTH:04-May-1971 FACILITY: [facility] LOCATION: [facility] PHYSICIAN:GREGORY Randel Pigg, MD  OPERATIVE REPORT  DATE OF PROCEDURE:  07/01/2020  PREOPERATIVE DIAGNOSIS:  Right knee medial and lateral meniscal tears.  POSTOPERATIVE DIAGNOSIS:  Right knee medial and lateral meniscal tears.  PROCEDURE:  Right knee arthroscopy with partial medial and lateral meniscectomy.  SURGEON:  Meredith Pel, MD  ASSISTANT:  Annie Main, PA.  INDICATIONS:  The patient is a 49 year old patient with right knee pain, who presents for operative management after explanation of risks and benefits.  PROCEDURE IN DETAIL:  The patient was brought to the operating room where general anesthetic was induced.  Preoperative antibiotics administered.  Timeout was called.  Right leg prescrubbed with alcohol and Betadine.  Prepped with ChloraPrep solution and  draped in a sterile manner.  Anterior inferolateral portal was created.  Anterior inferomedial portal was created under direct visualization.  Diagnostic arthroscopy was performed.  The patient had intact undersurface of the patella with mild grade II  chondromalacia within the trochlea.  No loose bodies in the medial or lateral gutter.  On the medial side, there was a 8 x 8 mm chondral defect area, which was partial thickness.  Loose chondral flaps around this defect were debrided.  The patient also  had posterior horn medial meniscal tear, which was significant.  There was both a radial and horizontal cleavage component.  The meniscal tear was debrided back to a stable rim using combination of basket punch and shaver.  Tibial plateau articular  surface intact.  ACL, PCL intact.  There were some cystic changes at the anterior portion of the ACL attachment, which were debrided.  On the lateral side the patient had intact articular cartilage on the lateral femoral condyle and lateral tibial   plateau.  The lateral meniscus had some fraying and small tears, which were debrided.  About 10-15% anterior, posterior width of the meniscus.  Small loose body underneath that lateral meniscus horn, which was removed.  Following meniscal debridement  thorough irrigation of the joint was performed.  Instruments were removed.  Portals were closed using 3-0 nylon.  Solution of Marcaine, morphine, clonidine injected into the knee for postop pain relief.  Ace wrap applied.  The patient tolerated the  procedure well without immediate complication.  Luke's assistance was required for opening and closing, limb positioning.  His assistance was a medical necessity.  HN/NUANCE  D:07/01/2020 T:07/01/2020 JOB:[id number]

## 2020-07-01 NOTE — Transfer of Care (Signed)
Immediate Anesthesia Transfer of Care Note  Patient: Nancy Armstrong  Procedure(s) Performed: right knee arthroscopy, debridement (Right Knee)  Patient Location: PACU  Anesthesia Type:General  Level of Consciousness: awake, alert , oriented and patient cooperative  Airway & Oxygen Therapy: Patient Spontanous Breathing and Patient connected to face mask oxygen  Post-op Assessment: Report given to RN and Post -op Vital signs reviewed and stable  Post vital signs: Reviewed and stable  Last Vitals:  Vitals Value Taken Time  BP 126/82 07/01/20 1115  Temp    Pulse 102 07/01/20 1115  Resp 16 07/01/20 1115  SpO2 100 % 07/01/20 1115  Vitals shown include unvalidated device data.  Last Pain:  Vitals:   07/01/20 0804  TempSrc:   PainSc: 4       Patients Stated Pain Goal: 3 (28/31/51 7616)  Complications: No complications documented.

## 2020-07-02 ENCOUNTER — Encounter (HOSPITAL_COMMUNITY): Payer: Self-pay | Admitting: Orthopedic Surgery

## 2020-07-09 ENCOUNTER — Inpatient Hospital Stay: Payer: BC Managed Care – PPO | Admitting: Surgical

## 2020-07-09 ENCOUNTER — Ambulatory Visit (INDEPENDENT_AMBULATORY_CARE_PROVIDER_SITE_OTHER): Payer: BC Managed Care – PPO | Admitting: Surgical

## 2020-07-09 ENCOUNTER — Encounter: Payer: Self-pay | Admitting: Surgical

## 2020-07-09 ENCOUNTER — Other Ambulatory Visit: Payer: Self-pay

## 2020-07-09 ENCOUNTER — Ambulatory Visit (HOSPITAL_COMMUNITY)
Admission: RE | Admit: 2020-07-09 | Discharge: 2020-07-09 | Disposition: A | Discharge status: Home / Self Care | Payer: BC Managed Care – PPO | Source: Ambulatory Visit | Attending: Surgical | Admitting: Surgical

## 2020-07-09 ENCOUNTER — Telehealth: Payer: Self-pay

## 2020-07-09 DIAGNOSIS — M79604 Pain in right leg: Secondary | ICD-10-CM | POA: Insufficient documentation

## 2020-07-09 DIAGNOSIS — S83281D Other tear of lateral meniscus, current injury, right knee, subsequent encounter: Secondary | ICD-10-CM

## 2020-07-09 MED ORDER — OXYCODONE-ACETAMINOPHEN 5-325 MG PO TABS: 1.0000 | ORAL_TABLET | Freq: Two times a day (BID) | ORAL | 0 refills | Status: DC | PRN

## 2020-07-09 NOTE — Telephone Encounter (Signed)
Scheduled doppler for 12/30 @ 245pm Sherilyn [address]
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
## 2020-07-20 ENCOUNTER — Telehealth: Payer: Self-pay

## 2020-07-20 NOTE — Telephone Encounter (Signed)
Patient called she stated she has been having popping and tension in her knee after arthroscopy she would like to know if that is normal or if it will go away. [phone]

## 2020-07-20 NOTE — Telephone Encounter (Signed)
Patient aware of the below  

## 2020-07-20 NOTE — Telephone Encounter (Signed)
Not unusual, expect it to improve, will evaluate at next follow-up

## 2020-07-22 ENCOUNTER — Encounter: Payer: Self-pay | Admitting: Family Medicine

## 2020-07-22 ENCOUNTER — Other Ambulatory Visit: Payer: Self-pay

## 2020-07-22 ENCOUNTER — Telehealth (INDEPENDENT_AMBULATORY_CARE_PROVIDER_SITE_OTHER): Payer: BC Managed Care – PPO | Admitting: Family Medicine

## 2020-07-22 VITALS — Ht 64.0 in | Wt 200.0 lb

## 2020-07-22 DIAGNOSIS — J029 Acute pharyngitis, unspecified: Secondary | ICD-10-CM

## 2020-07-22 DIAGNOSIS — R059 Cough, unspecified: Secondary | ICD-10-CM | POA: Diagnosis not present

## 2020-07-22 LAB — POCT RAPID STREP A (OFFICE): Rapid Strep A Screen: NEGATIVE

## 2020-07-22 LAB — POC COVID19 BINAXNOW: SARS Coronavirus 2 Ag: NEGATIVE

## 2020-07-22 LAB — POCT INFLUENZA A/B
Influenza A, POC: NEGATIVE
Influenza B, POC: NEGATIVE

## 2020-07-22 NOTE — Progress Notes (Signed)
Complains of runny nose, sore throat, body aches Denies loss of taste/smell, cough, chest congestion, fever, headache.  Was exposed Thursday of last week, without mask for at least 30 minutes. Was snacking and drinking at the time.   Had Covid Jan 2021. Has had 2 covid vaccines, last one in April. Has not had flu shot. No one else in home with symptoms.  No hx of diabetes, heart problems, or breathing problems.  Virtual Visit via Telephone Note  I connected with Nancy Armstrong on 07/22/20 at  1:30 PM EST by telephone and verified that I am speaking with the correct person using two identifiers.  Location: Patient: at work Provider: clinic   I discussed the limitations, risks, security and privacy concerns of performing an evaluation and management service by telephone and the availability of in person appointments. I also discussed with the patient that there may be a patient responsible charge related to this service. The patient expressed understanding and agreed to proceed.   History of Present Illness:   nasal congestion, sore throat-"scratchy",  Sister -lives with patient+ COVID symptoms-has not tested No fever Cough-drainage No n/d/v No rash No urinary symptoms COVID in 1/21 Observations/Objective: No vitals Quality management at work Exposure last week on Thursday No flu shot COVID -4/21last vaccine, no booster Assessment and Plan: 1. Sore throat - Rapid Strep A-negative  2. Cough - POC COVID-19-negative - Influenza A/B-negative PCR pending Follow Up Instructions:    I discussed the assessment and treatment plan with the patient. The patient was provided an opportunity to ask questions and all were answered. The patient agreed with the plan and demonstrated an understanding of the instructions.   The patient was advised to call back or seek an in-person evaluation if the symptoms worsen or if the condition fails to improve as anticipated.  I provided 23minutes of  non-face-to-face time during this encounter.   Nancy Armstrong Hannah Beat, MD

## 2020-07-23 ENCOUNTER — Other Ambulatory Visit: Payer: Self-pay | Admitting: Surgical

## 2020-07-23 ENCOUNTER — Other Ambulatory Visit: Payer: Self-pay | Admitting: Internal Medicine

## 2020-07-23 NOTE — Telephone Encounter (Signed)
Is 90 day supply okay Sir? 

## 2020-07-24 LAB — NOVEL CORONAVIRUS, NAA: SARS-CoV-2, NAA: NOT DETECTED

## 2020-07-24 LAB — SARS-COV-2, NAA 2 DAY TAT

## 2020-07-31 ENCOUNTER — Other Ambulatory Visit: Payer: Self-pay | Admitting: Legal Medicine

## 2020-08-04 ENCOUNTER — Encounter: Payer: Self-pay | Admitting: Family Medicine

## 2020-08-04 ENCOUNTER — Other Ambulatory Visit: Payer: Self-pay

## 2020-08-04 ENCOUNTER — Ambulatory Visit (INDEPENDENT_AMBULATORY_CARE_PROVIDER_SITE_OTHER): Payer: BC Managed Care – PPO | Admitting: Family Medicine

## 2020-08-04 VITALS — BP 116/80 | HR 86 | Temp 97.4°F | Resp 16 | Ht 64.0 in | Wt 209.4 lb

## 2020-08-04 DIAGNOSIS — M79641 Pain in right hand: Secondary | ICD-10-CM

## 2020-08-04 DIAGNOSIS — M25511 Pain in right shoulder: Secondary | ICD-10-CM | POA: Diagnosis not present

## 2020-08-04 DIAGNOSIS — M79642 Pain in left hand: Secondary | ICD-10-CM

## 2020-08-04 DIAGNOSIS — M25512 Pain in left shoulder: Secondary | ICD-10-CM | POA: Diagnosis not present

## 2020-08-04 MED ORDER — ZOLPIDEM TARTRATE 5 MG PO TABS: 5.0000 mg | ORAL_TABLET | Freq: Every evening | ORAL | 1 refills | Status: DC | PRN

## 2020-08-04 MED ORDER — DICLOFENAC SODIUM 75 MG PO TBEC: 75.0000 mg | DELAYED_RELEASE_TABLET | Freq: Two times a day (BID) | ORAL | 0 refills | Status: DC

## 2020-08-04 MED ORDER — FENOFIBRATE 160 MG PO TABS: 160.0000 mg | ORAL_TABLET | Freq: Every day | ORAL | 0 refills | Status: DC
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
## 2020-08-06 ENCOUNTER — Encounter: Payer: Self-pay | Admitting: Orthopedic Surgery

## 2020-08-06 ENCOUNTER — Ambulatory Visit (INDEPENDENT_AMBULATORY_CARE_PROVIDER_SITE_OTHER): Payer: BC Managed Care – PPO | Admitting: Orthopedic Surgery

## 2020-08-06 DIAGNOSIS — S83281D Other tear of lateral meniscus, current injury, right knee, subsequent encounter: Secondary | ICD-10-CM
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
## 2020-08-09 ENCOUNTER — Encounter: Payer: Self-pay | Admitting: Family Medicine

## 2020-08-11 ENCOUNTER — Ambulatory Visit: Payer: BC Managed Care – PPO | Admitting: Internal Medicine

## 2020-08-20 DIAGNOSIS — R0789 Other chest pain: Secondary | ICD-10-CM | POA: Diagnosis not present

## 2020-08-20 DIAGNOSIS — R079 Chest pain, unspecified: Secondary | ICD-10-CM | POA: Diagnosis not present

## 2020-08-20 DIAGNOSIS — R0602 Shortness of breath: Secondary | ICD-10-CM | POA: Diagnosis not present

## 2020-08-20 DIAGNOSIS — R072 Precordial pain: Secondary | ICD-10-CM | POA: Diagnosis not present

## 2020-08-21 ENCOUNTER — Encounter: Payer: Self-pay | Admitting: Family Medicine

## 2020-08-21 ENCOUNTER — Telehealth: Payer: Self-pay

## 2020-08-21 ENCOUNTER — Ambulatory Visit (INDEPENDENT_AMBULATORY_CARE_PROVIDER_SITE_OTHER): Payer: BC Managed Care – PPO | Admitting: Family Medicine

## 2020-08-21 VITALS — BP 112/74

## 2020-08-21 DIAGNOSIS — R0789 Other chest pain: Secondary | ICD-10-CM | POA: Diagnosis not present

## 2020-08-21 LAB — POC COVID19 BINAXNOW: SARS Coronavirus 2 Ag: NEGATIVE

## 2020-08-21 MED ORDER — LORAZEPAM 0.5 MG PO TABS: 0.5000 mg | ORAL_TABLET | Freq: Every day | ORAL | 0 refills | Status: DC | PRN
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
## 2020-08-21 NOTE — Telephone Encounter (Signed)
Pt transferred to me. States she has been having chest pressure and was having SOB yesterday. She went to [facility] ED last night and they ran tests and they think it was just stress. No covid test done. Does have sinus drainage, had headache after given nitroglycerin.   Denies cough, fever, diarrhea, urinary symptoms. Employee tested positive but was not within 6 ft. They tested positive on Tuesday.  Did have covid shots (2), did not have flu shot. Scheduled for appointment. Will be rapid tested beforehand due to sinus drainage, chest pressure, SOB, and headache.

## 2020-08-22 ENCOUNTER — Other Ambulatory Visit: Payer: Self-pay | Admitting: Surgical

## 2020-08-24 ENCOUNTER — Other Ambulatory Visit: Payer: Self-pay | Admitting: Family Medicine

## 2020-08-24 NOTE — Telephone Encounter (Signed)
Pt was seen. Kc

## 2020-08-27 ENCOUNTER — Other Ambulatory Visit: Payer: Self-pay | Admitting: Family Medicine

## 2020-08-27 DIAGNOSIS — M79641 Pain in right hand: Secondary | ICD-10-CM

## 2020-08-27 DIAGNOSIS — M79642 Pain in left hand: Secondary | ICD-10-CM

## 2020-08-27 DIAGNOSIS — M25512 Pain in left shoulder: Secondary | ICD-10-CM

## 2020-08-27 DIAGNOSIS — M25511 Pain in right shoulder: Secondary | ICD-10-CM

## 2020-09-07 ENCOUNTER — Encounter: Payer: Self-pay | Admitting: Family Medicine

## 2020-09-08 ENCOUNTER — Other Ambulatory Visit: Payer: Self-pay

## 2020-09-08 DIAGNOSIS — R0789 Other chest pain: Secondary | ICD-10-CM

## 2020-09-11 ENCOUNTER — Encounter: Payer: Self-pay | Admitting: Nurse Practitioner

## 2020-09-11 ENCOUNTER — Telehealth (INDEPENDENT_AMBULATORY_CARE_PROVIDER_SITE_OTHER): Payer: BC Managed Care – PPO | Admitting: Nurse Practitioner

## 2020-09-11 VITALS — Ht 64.0 in | Wt 209.0 lb

## 2020-09-11 DIAGNOSIS — J310 Chronic rhinitis: Secondary | ICD-10-CM | POA: Diagnosis not present

## 2020-09-11 DIAGNOSIS — J301 Allergic rhinitis due to pollen: Secondary | ICD-10-CM | POA: Diagnosis not present

## 2020-09-11 DIAGNOSIS — J329 Chronic sinusitis, unspecified: Secondary | ICD-10-CM | POA: Diagnosis not present

## 2020-09-11 DIAGNOSIS — R5383 Other fatigue: Secondary | ICD-10-CM | POA: Diagnosis not present

## 2020-09-11 DIAGNOSIS — J029 Acute pharyngitis, unspecified: Secondary | ICD-10-CM

## 2020-09-11 LAB — POC COVID19 BINAXNOW: SARS Coronavirus 2 Ag: NEGATIVE

## 2020-09-11 LAB — POCT RAPID STREP A (OFFICE): Rapid Strep A Screen: NEGATIVE

## 2020-09-11 MED ORDER — CETIRIZINE HCL 10 MG PO TABS
10.0000 mg | ORAL_TABLET | Freq: Every day | ORAL | 6 refills | Status: AC
Start: 2020-09-11 — End: ?

## 2020-09-11 MED ORDER — FLUTICASONE PROPIONATE 50 MCG/ACT NA SUSP: 2.0000 | Freq: Every day | NASAL | 6 refills | Status: DC
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
## 2020-09-12 LAB — SARS-COV-2, NAA 2 DAY TAT

## 2020-09-12 LAB — NOVEL CORONAVIRUS, NAA: SARS-CoV-2, NAA: NOT DETECTED

## 2020-09-14 ENCOUNTER — Ambulatory Visit (INDEPENDENT_AMBULATORY_CARE_PROVIDER_SITE_OTHER): Payer: BC Managed Care – PPO | Admitting: Nurse Practitioner

## 2020-09-14 ENCOUNTER — Encounter: Payer: Self-pay | Admitting: Nurse Practitioner

## 2020-09-14 ENCOUNTER — Ambulatory Visit: Payer: BC Managed Care – PPO

## 2020-09-14 VITALS — BP 122/80 | HR 92 | Temp 97.9°F | Resp 18

## 2020-09-14 DIAGNOSIS — J0100 Acute maxillary sinusitis, unspecified: Secondary | ICD-10-CM

## 2020-09-14 MED ORDER — AZITHROMYCIN 250 MG PO TABS: ORAL_TABLET | ORAL | 0 refills | Status: DC

## 2020-09-14 NOTE — Patient Instructions (Signed)
Continue Zyrtec, Flonase, and Mucinex Follow-up as needed Sinusitis, Adult Sinusitis is soreness and swelling (inflammation) of your sinuses. Sinuses are hollow spaces in the bones around your face. They are located:  Around your eyes.  In the middle of your forehead.  Behind your nose.  In your cheekbones. Your sinuses and nasal passages are lined with a fluid called mucus. Mucus drains out of your sinuses. Swelling can trap mucus in your sinuses. This lets germs (bacteria, virus, or fungus) grow, which leads to infection. Most of the time, this condition is caused by a virus. What are the causes? This condition is caused by:  Allergies.  Asthma.  Germs.  Things that block your nose or sinuses.  Growths in the nose (nasal polyps).  Chemicals or irritants in the air.  Fungus (rare). What increases the risk? You are more likely to develop this condition if:  You have a weak body defense system (immune system).  You do a lot of swimming or diving.  You use nasal sprays too much.  You smoke. What are the signs or symptoms? The main symptoms of this condition are pain and a feeling of pressure around the sinuses. Other symptoms include:  Stuffy nose (congestion).  Runny nose (drainage).  Swelling and warmth in the sinuses.  Headache.  Toothache.  A cough that may get worse at night.  Mucus that collects in the throat or the back of the nose (postnasal drip).  Being unable to smell and taste.  Being very tired (fatigue).  A fever.  Sore throat.  Bad breath. How is this diagnosed? This condition is diagnosed based on:  Your symptoms.  Your medical history.  A physical exam.  Tests to find out if your condition is short-term (acute) or long-term (chronic). Your doctor may: ? Check your nose for growths (polyps). ? Check your sinuses using a tool that has a light (endoscope). ? Check for allergies or germs. ? Do imaging tests, such as an MRI or CT  scan. How is this treated? Treatment for this condition depends on the cause and whether it is short-term or long-term.  If caused by a virus, your symptoms should go away on their own within 10 days. You may be given medicines to relieve symptoms. They include: ? Medicines that shrink swollen tissue in the nose. ? Medicines that treat allergies (antihistamines). ? A spray that treats swelling of the nostrils. ? Rinses that help get rid of thick mucus in your nose (nasal saline washes).  If caused by bacteria, your doctor may wait to see if you will get better without treatment. You may be given antibiotic medicine if you have: ? A very bad infection. ? A weak body defense system.  If caused by growths in the nose, you may need to have surgery. Follow these instructions at home: Medicines  Take, use, or apply over-the-counter and prescription medicines only as told by your doctor. These may include nasal sprays.  If you were prescribed an antibiotic medicine, take it as told by your doctor. Do not stop taking the antibiotic even if you start to feel better. Hydrate and humidify  Drink enough water to keep your pee (urine) pale yellow.  Use a cool mist humidifier to keep the humidity level in your home above 50%.  Breathe in steam for 10-15 minutes, 3-4 times a day, or as told by your doctor. You can do this in the bathroom while a hot shower is running.  Try not to  spend time in cool or dry air.   Rest  Rest as much as you can.  Sleep with your head raised (elevated).  Make sure you get enough sleep each night. General instructions  Put a warm, moist washcloth on your face 3-4 times a day, or as often as told by your doctor. This will help with discomfort.  Wash your hands often with soap and water. If there is no soap and water, use hand sanitizer.  Do not smoke. Avoid being around people who are smoking (secondhand smoke).  Keep all follow-up visits as told by your  doctor. This is important.   Contact a doctor if:  You have a fever.  Your symptoms get worse.  Your symptoms do not get better within 10 days. Get help right away if:  You have a very bad headache.  You cannot stop throwing up (vomiting).  You have very bad pain or swelling around your face or eyes.  You have trouble seeing.  You feel confused.  Your neck is stiff.  You have trouble breathing. Summary  Sinusitis is swelling of your sinuses. Sinuses are hollow spaces in the bones around your face.  This condition is caused by tissues in your nose that become inflamed or swollen. This traps germs. These can lead to infection.  If you were prescribed an antibiotic medicine, take it as told by your doctor. Do not stop taking it even if you start to feel better.  Keep all follow-up visits as told by your doctor. This is important. This information is not intended to replace advice given to you by your health care provider. Make sure you discuss any questions you have with your health care provider. Document Revised: 11/27/2017 Document Reviewed: 11/27/2017 Elsevier Patient Education  2021 [employer]. Azithromycin tablets What is this medicine? AZITHROMYCIN (az ith roe MYE sin) is a macrolide antibiotic. It is used to treat or prevent certain kinds of bacterial infections. It will not work for colds, flu, or other viral infections. This medicine may be used for other purposes; ask your health care provider or pharmacist if you have questions. COMMON BRAND NAME(S): Zithromax, Zithromax Tri-Pak, Zithromax Z-Pak What should I tell my health care provider before I take this medicine? They need to know if you have any of these conditions:  history of blood diseases, like leukemia  history of irregular heartbeat  kidney disease  liver disease  myasthenia gravis  an unusual or allergic reaction to azithromycin, erythromycin, other macrolide antibiotics, foods, dyes, or  preservatives  pregnant or trying to get pregnant  breast-feeding How should I use this medicine? Take this medicine by mouth with a full glass of water. Follow the directions on the prescription label. The tablets can be taken with food or on an empty stomach. If the medicine upsets your stomach, take it with food. Take your medicine at regular intervals. Do not take your medicine more often than directed. Take all of your medicine as directed even if you think your are better. Do not skip doses or stop your medicine early. Talk to your pediatrician regarding the use of this medicine in children. While this drug may be prescribed for children as young as 6 months for selected conditions, precautions do apply. Overdosage: If you think you have taken too much of this medicine contact a poison control center or emergency room at once. NOTE: This medicine is only for you. Do not share this medicine with others. What if I miss a  dose? If you miss a dose, take it as soon as you can. If it is almost time for your next dose, take only that dose. Do not take double or extra doses. What may interact with this medicine? Do not take this medicine with any of the following medications:  cisapride  dronedarone  pimozide  thioridazine This medicine may also interact with the following medications:  antacids that contain aluminum or magnesium  birth control pills  colchicine  cyclosporine  digoxin  ergot alkaloids like dihydroergotamine, ergotamine  nelfinavir  other medicines that prolong the QT interval (an abnormal heart rhythm)  phenytoin  warfarin This list may not describe all possible interactions. Give your health care provider a list of all the medicines, herbs, non-prescription drugs, or dietary supplements you use. Also tell them if you smoke, drink alcohol, or use illegal drugs. Some items may interact with your medicine. What should I watch for while using this  medicine? Tell your doctor or healthcare provider if your symptoms do not start to get better or if they get worse. This medicine may cause serious skin reactions. They can happen weeks to months after starting the medicine. Contact your healthcare provider right away if you notice fevers or flu-like symptoms with a rash. The rash may be red or purple and then turn into blisters or peeling of the skin. Or, you might notice a red rash with swelling of the face, lips or lymph nodes in your neck or under your arms. Do not treat diarrhea with over the counter products. Contact your doctor if you have diarrhea that lasts more than 2 days or if it is severe and watery. This medicine can make you more sensitive to the sun. Keep out of the sun. If you cannot avoid being in the sun, wear protective clothing and use sunscreen. Do not use sun lamps or tanning beds/booths. What side effects may I notice from receiving this medicine? Side effects that you should report to your doctor or health care professional as soon as possible:  allergic reactions like skin rash, itching or hives, swelling of the face, lips, or tongue  bloody or watery diarrhea  breathing problems  chest pain  fast, irregular heartbeat  muscle weakness  rash, fever, and swollen lymph nodes  redness, blistering, peeling, or loosening of the skin, including inside the mouth  signs and symptoms of liver injury like dark yellow or brown urine; general ill feeling or flu-like symptoms; light-colored stools; loss of appetite; nausea; right upper belly pain; unusually weak or tired; yellowing of the eyes or skin  white patches or sores in the mouth  unusually weak or tired Side effects that usually do not require medical attention (report to your doctor or health care professional if they continue or are bothersome):  diarrhea  nausea  stomach pain  vomiting This list may not describe all possible side effects. Call your doctor  for medical advice about side effects. You may report side effects to FDA at 1-800-FDA-1088. Where should I keep my medicine? Keep out of the reach of children. Store at room temperature between 15 and 30 degrees C (59 and 86 degrees F). Throw away any unused medicine after the expiration date. NOTE: This sheet is a summary. It may not cover all possible information. If you have questions about this medicine, talk to your doctor, pharmacist, or health care provider.  2021 Elsevier/Gold Standard (2018-10-04 17:19:20)
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
## 2020-09-16 DIAGNOSIS — R079 Chest pain, unspecified: Secondary | ICD-10-CM | POA: Diagnosis not present

## 2020-09-16 DIAGNOSIS — R0789 Other chest pain: Secondary | ICD-10-CM | POA: Diagnosis not present

## 2020-09-22 ENCOUNTER — Other Ambulatory Visit: Payer: Self-pay | Admitting: Family Medicine

## 2020-09-22 DIAGNOSIS — R0789 Other chest pain: Secondary | ICD-10-CM

## 2020-09-22 MED ORDER — LORAZEPAM 0.5 MG PO TABS
0.5000 mg | ORAL_TABLET | Freq: Every day | ORAL | 2 refills | Status: DC | PRN
Start: 2020-09-22 — End: 2021-02-23

## 2020-09-27 ENCOUNTER — Other Ambulatory Visit: Payer: Self-pay | Admitting: Surgical

## 2020-09-28 ENCOUNTER — Other Ambulatory Visit: Payer: Self-pay | Admitting: Family Medicine

## 2020-09-28 DIAGNOSIS — M79642 Pain in left hand: Secondary | ICD-10-CM

## 2020-09-28 DIAGNOSIS — M25511 Pain in right shoulder: Secondary | ICD-10-CM

## 2020-09-28 DIAGNOSIS — M79641 Pain in right hand: Secondary | ICD-10-CM

## 2020-09-28 DIAGNOSIS — M25512 Pain in left shoulder: Secondary | ICD-10-CM

## 2020-09-28 NOTE — Telephone Encounter (Signed)
Please advise 

## 2020-10-02 ENCOUNTER — Other Ambulatory Visit: Payer: Self-pay | Admitting: Family Medicine

## 2020-10-20 ENCOUNTER — Telehealth: Payer: Self-pay

## 2020-10-20 NOTE — Telephone Encounter (Signed)
Attempted to complete PA for Nurtec via covermymeds. PA was cancelled with notification stating "A PA is already in process for this member/drug." PA from last year expires 10/28/2020.

## 2020-10-26 ENCOUNTER — Other Ambulatory Visit: Payer: Self-pay | Admitting: Physician Assistant

## 2020-10-26 DIAGNOSIS — M79641 Pain in right hand: Secondary | ICD-10-CM

## 2020-10-26 DIAGNOSIS — M25512 Pain in left shoulder: Secondary | ICD-10-CM

## 2020-10-26 DIAGNOSIS — M25511 Pain in right shoulder: Secondary | ICD-10-CM

## 2020-10-26 DIAGNOSIS — M79642 Pain in left hand: Secondary | ICD-10-CM

## 2020-10-30 ENCOUNTER — Other Ambulatory Visit: Payer: Self-pay | Admitting: Family Medicine

## 2020-11-23 ENCOUNTER — Other Ambulatory Visit: Payer: Self-pay | Admitting: Physician Assistant

## 2020-11-23 DIAGNOSIS — M79641 Pain in right hand: Secondary | ICD-10-CM

## 2020-11-23 DIAGNOSIS — M79642 Pain in left hand: Secondary | ICD-10-CM

## 2020-11-23 DIAGNOSIS — M25511 Pain in right shoulder: Secondary | ICD-10-CM

## 2020-11-23 DIAGNOSIS — M25512 Pain in left shoulder: Secondary | ICD-10-CM

## 2020-12-02 ENCOUNTER — Other Ambulatory Visit: Payer: Self-pay | Admitting: Family Medicine

## 2020-12-03 ENCOUNTER — Encounter: Payer: Self-pay | Admitting: Family Medicine

## 2020-12-03 ENCOUNTER — Ambulatory Visit (INDEPENDENT_AMBULATORY_CARE_PROVIDER_SITE_OTHER): Payer: BC Managed Care – PPO | Admitting: Family Medicine

## 2020-12-03 VITALS — BP 128/78 | HR 105 | Temp 97.2°F | Ht 64.0 in | Wt 215.0 lb

## 2020-12-03 DIAGNOSIS — M21941 Unspecified acquired deformity of hand, right hand: Secondary | ICD-10-CM

## 2020-12-03 DIAGNOSIS — R059 Cough, unspecified: Secondary | ICD-10-CM

## 2020-12-03 DIAGNOSIS — R519 Headache, unspecified: Secondary | ICD-10-CM

## 2020-12-03 DIAGNOSIS — M791 Myalgia, unspecified site: Secondary | ICD-10-CM

## 2020-12-03 DIAGNOSIS — M20001 Unspecified deformity of right finger(s): Secondary | ICD-10-CM

## 2020-12-03 LAB — POC COVID19 BINAXNOW: SARS Coronavirus 2 Ag: NEGATIVE

## 2020-12-03 LAB — POCT INFLUENZA A/B
Influenza A, POC: NEGATIVE
Influenza B, POC: NEGATIVE

## 2020-12-03 NOTE — Patient Instructions (Signed)
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
## 2020-12-08 ENCOUNTER — Other Ambulatory Visit: Payer: Self-pay | Admitting: Family Medicine

## 2020-12-08 ENCOUNTER — Encounter: Payer: Self-pay | Admitting: Family Medicine

## 2020-12-08 ENCOUNTER — Telehealth: Payer: Self-pay

## 2020-12-08 DIAGNOSIS — J0100 Acute maxillary sinusitis, unspecified: Secondary | ICD-10-CM

## 2020-12-08 MED ORDER — BENZONATATE 200 MG PO CAPS: 200.0000 mg | ORAL_CAPSULE | Freq: Three times a day (TID) | ORAL | 0 refills | Status: DC | PRN

## 2020-12-08 MED ORDER — FLUCONAZOLE 150 MG PO TABS: 150.0000 mg | ORAL_TABLET | Freq: Every day | ORAL | 0 refills | Status: AC

## 2020-12-08 MED ORDER — AMOXICILLIN 875 MG PO TABS
875.0000 mg | ORAL_TABLET | Freq: Two times a day (BID) | ORAL | 0 refills | Status: AC
Start: 2020-12-08 — End: 2020-12-18

## 2020-12-08 NOTE — Telephone Encounter (Signed)
Sent fluconazole. Sent tessalon perles. Kc

## 2020-12-08 NOTE — Telephone Encounter (Signed)
Pt called states she saw Dr. Tobie Poet Thursday and does not feel better. Still has sore throat, cough, congestion. Pt was neg flu and covid at that time.   Spoke w/ Dr. Dr sent in amoxicillin.   Called made pt aware. Pt then stated amoxicillin causes yeast for her and requests diflucan. Also requests cough medication. Pt is requesting work note for today as well due to not feeling better.   Royce Macadamia, [place] 12/08/20 8:09 AM

## 2020-12-10 ENCOUNTER — Encounter: Payer: Self-pay | Admitting: Nurse Practitioner

## 2020-12-10 ENCOUNTER — Telehealth (INDEPENDENT_AMBULATORY_CARE_PROVIDER_SITE_OTHER): Payer: BC Managed Care – PPO | Admitting: Nurse Practitioner

## 2020-12-10 VITALS — Ht 64.0 in | Wt 215.0 lb

## 2020-12-10 DIAGNOSIS — R059 Cough, unspecified: Secondary | ICD-10-CM

## 2020-12-10 DIAGNOSIS — J0100 Acute maxillary sinusitis, unspecified: Secondary | ICD-10-CM

## 2020-12-10 DIAGNOSIS — Z20822 Contact with and (suspected) exposure to covid-19: Secondary | ICD-10-CM | POA: Diagnosis not present

## 2020-12-10 LAB — POC COVID19 BINAXNOW: SARS Coronavirus 2 Ag: NEGATIVE

## 2020-12-10 MED ORDER — PROMETHAZINE-DM 6.25-15 MG/5ML PO SYRP: 5.0000 mL | ORAL_SOLUTION | Freq: Four times a day (QID) | ORAL | 0 refills | Status: DC | PRN
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
## 2020-12-24 ENCOUNTER — Encounter: Payer: Self-pay | Admitting: Legal Medicine

## 2020-12-24 ENCOUNTER — Ambulatory Visit (INDEPENDENT_AMBULATORY_CARE_PROVIDER_SITE_OTHER): Payer: BC Managed Care – PPO | Admitting: Legal Medicine

## 2020-12-24 ENCOUNTER — Other Ambulatory Visit: Payer: Self-pay

## 2020-12-24 DIAGNOSIS — M7552 Bursitis of left shoulder: Secondary | ICD-10-CM | POA: Insufficient documentation
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
## 2021-01-15 ENCOUNTER — Ambulatory Visit: Payer: BC Managed Care – PPO | Admitting: Legal Medicine

## 2021-01-21 ENCOUNTER — Other Ambulatory Visit: Payer: Self-pay | Admitting: Internal Medicine

## 2021-01-21 NOTE — Telephone Encounter (Signed)
I left her a detailed message on her cell # with what Dr Carlean Purl advised and we will await and see what she wants to do.

## 2021-01-21 NOTE — Telephone Encounter (Signed)
Okay to refill Sir? 

## 2021-01-27 NOTE — Telephone Encounter (Signed)
I left her another detailed message with Dr Celesta Aver advise and told her to please call me.

## 2021-01-31 ENCOUNTER — Other Ambulatory Visit: Payer: Self-pay | Admitting: Physician Assistant

## 2021-02-09 ENCOUNTER — Other Ambulatory Visit: Payer: Self-pay

## 2021-02-09 MED ORDER — AMITRIPTYLINE HCL 25 MG PO TABS: ORAL_TABLET | ORAL | 1 refills | Status: DC

## 2021-02-09 NOTE — Telephone Encounter (Signed)
Pt requesting PCP take over medication as she has not seen other provider in over year and he requested PCP take over.   Nancy Armstrong 02/09/21 3:56 PM

## 2021-02-12 NOTE — Telephone Encounter (Signed)
This encounter was created in error - please disregard.

## 2021-02-18 ENCOUNTER — Other Ambulatory Visit: Payer: Self-pay

## 2021-02-18 MED ORDER — ZOLPIDEM TARTRATE 5 MG PO TABS: 5.0000 mg | ORAL_TABLET | Freq: Every evening | ORAL | 5 refills | Status: DC | PRN

## 2021-02-23 ENCOUNTER — Other Ambulatory Visit: Payer: Self-pay

## 2021-02-23 ENCOUNTER — Ambulatory Visit (INDEPENDENT_AMBULATORY_CARE_PROVIDER_SITE_OTHER): Payer: BC Managed Care – PPO | Admitting: Family Medicine

## 2021-02-23 ENCOUNTER — Encounter: Payer: Self-pay | Admitting: Family Medicine

## 2021-02-23 VITALS — BP 130/68 | HR 92 | Temp 96.3°F | Ht 64.0 in | Wt 217.0 lb

## 2021-02-23 DIAGNOSIS — R41840 Attention and concentration deficit: Secondary | ICD-10-CM

## 2021-02-23 DIAGNOSIS — Z90710 Acquired absence of both cervix and uterus: Secondary | ICD-10-CM

## 2021-02-23 DIAGNOSIS — Z0001 Encounter for general adult medical examination with abnormal findings: Secondary | ICD-10-CM | POA: Diagnosis not present

## 2021-02-23 DIAGNOSIS — Z1231 Encounter for screening mammogram for malignant neoplasm of breast: Secondary | ICD-10-CM

## 2021-02-23 DIAGNOSIS — Z6837 Body mass index (BMI) 37.0-37.9, adult: Secondary | ICD-10-CM

## 2021-02-23 MED ORDER — LORAZEPAM 0.5 MG PO TABS: 0.5000 mg | ORAL_TABLET | Freq: Every day | ORAL | 2 refills | Status: DC | PRN

## 2021-02-23 MED ORDER — ZOLPIDEM TARTRATE 10 MG PO TABS: 10.0000 mg | ORAL_TABLET | Freq: Every evening | ORAL | 5 refills | Status: DC | PRN

## 2021-02-23 MED ORDER — AMPHETAMINE-DEXTROAMPHETAMINE 10 MG PO TABS: 10.0000 mg | ORAL_TABLET | Freq: Two times a day (BID) | ORAL | 0 refills | Status: DC

## 2021-02-23 MED ORDER — PROMETHAZINE HCL 25 MG PO TABS: 25.0000 mg | ORAL_TABLET | Freq: Three times a day (TID) | ORAL | 0 refills | Status: DC | PRN

## 2021-02-23 MED ORDER — SEMAGLUTIDE-WEIGHT MANAGEMENT 0.25 MG/0.5ML ~~LOC~~ SOAJ: 0.2500 mg | SUBCUTANEOUS | 0 refills | Status: DC
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
## 2021-02-23 NOTE — Patient Instructions (Signed)

## 2021-02-24 ENCOUNTER — Other Ambulatory Visit: Payer: BC Managed Care – PPO

## 2021-03-01 ENCOUNTER — Other Ambulatory Visit: Payer: BC Managed Care – PPO

## 2021-03-01 DIAGNOSIS — Z0001 Encounter for general adult medical examination with abnormal findings: Secondary | ICD-10-CM | POA: Diagnosis not present

## 2021-03-02 LAB — COMPREHENSIVE METABOLIC PANEL
ALT: 18 IU/L (ref 0–32)
AST: 15 IU/L (ref 0–40)
Albumin/Globulin Ratio: 1.9 (ref 1.2–2.2)
Albumin: 4.5 g/dL (ref 3.8–4.8)
Alkaline Phosphatase: 137 IU/L — ABNORMAL HIGH (ref 44–121)
BUN/Creatinine Ratio: 14 (ref 9–23)
BUN: 14 mg/dL (ref 6–24)
Bilirubin Total: 0.3 mg/dL (ref 0.0–1.2)
CO2: 25 mmol/L (ref 20–29)
Calcium: 9.8 mg/dL (ref 8.7–10.2)
Chloride: 96 mmol/L (ref 96–106)
Creatinine, Ser: 1 mg/dL (ref 0.57–1.00)
Globulin, Total: 2.4 g/dL (ref 1.5–4.5)
Glucose: 127 mg/dL — ABNORMAL HIGH (ref 65–99)
Potassium: 4.6 mmol/L (ref 3.5–5.2)
Sodium: 143 mmol/L (ref 134–144)
Total Protein: 6.9 g/dL (ref 6.0–8.5)
eGFR: 69 mL/min/{1.73_m2} (ref >59)

## 2021-03-02 LAB — CBC WITH DIFFERENTIAL/PLATELET
Basophils Absolute: 0.1 10*3/uL (ref 0.0–0.2)
Basos: 1 %
EOS (ABSOLUTE): 0.3 10*3/uL (ref 0.0–0.4)
Eos: 3 %
Hematocrit: 43.2 % (ref 34.0–46.6)
Hemoglobin: 13.8 g/dL (ref 11.1–15.9)
Immature Grans (Abs): 0 10*3/uL (ref 0.0–0.1)
Immature Granulocytes: 0 %
Lymphocytes Absolute: 1.7 10*3/uL (ref 0.7–3.1)
Lymphs: 22 %
MCH: 27.9 pg (ref 26.6–33.0)
MCHC: 31.9 g/dL (ref 31.5–35.7)
MCV: 87 fL (ref 79–97)
Monocytes Absolute: 0.5 10*3/uL (ref 0.1–0.9)
Monocytes: 7 %
Neutrophils Absolute: 5.1 10*3/uL (ref 1.4–7.0)
Neutrophils: 67 %
Platelets: 380 10*3/uL (ref 150–450)
RBC: 4.94 x10E6/uL (ref 3.77–5.28)
RDW: 14.7 % (ref 11.7–15.4)
WBC: 7.7 10*3/uL (ref 3.4–10.8)

## 2021-03-02 LAB — CARDIOVASCULAR RISK ASSESSMENT

## 2021-03-02 LAB — TSH: TSH: 5.57 u[IU]/mL — ABNORMAL HIGH (ref 0.450–4.500)

## 2021-03-02 LAB — HEMOGLOBIN A1C
Est. average glucose Bld gHb Est-mCnc: 128 mg/dL
Hgb A1c MFr Bld: 6.1 % — ABNORMAL HIGH (ref 4.8–5.6)

## 2021-03-02 LAB — LIPID PANEL
Chol/HDL Ratio: 4.5 ratio — ABNORMAL HIGH (ref 0.0–4.4)
Cholesterol, Total: 267 mg/dL — ABNORMAL HIGH (ref 100–199)
HDL: 59 mg/dL (ref >39)
LDL Chol Calc (NIH): 172 mg/dL — ABNORMAL HIGH (ref 0–99)
Triglycerides: 193 mg/dL — ABNORMAL HIGH (ref 0–149)
VLDL Cholesterol Cal: 36 mg/dL (ref 5–40)

## 2021-03-03 ENCOUNTER — Telehealth: Payer: Self-pay

## 2021-03-03 MED ORDER — WEGOVY 0.25 MG/0.5ML ~~LOC~~ SOAJ: 0.2500 mg | SUBCUTANEOUS | 0 refills | Status: DC

## 2021-03-03 NOTE — Telephone Encounter (Signed)
PA submitted for ozempic via covermymeds and denied due to patient not having DM. Per Dr. Tobie Poet ok to change to [facility] (patient agreed).

## 2021-03-04 ENCOUNTER — Other Ambulatory Visit: Payer: Self-pay

## 2021-03-04 ENCOUNTER — Telehealth: Payer: Self-pay

## 2021-03-04 LAB — SPECIMEN STATUS REPORT

## 2021-03-04 LAB — T4, FREE: Free T4: 1.27 ng/dL (ref 0.82–1.77)

## 2021-03-04 NOTE — Telephone Encounter (Signed)
PA submitted and approved covermymeds for [facility]

## 2021-03-05 ENCOUNTER — Other Ambulatory Visit: Payer: Self-pay | Admitting: Nurse Practitioner

## 2021-03-05 ENCOUNTER — Other Ambulatory Visit: Payer: Self-pay | Admitting: Family Medicine

## 2021-03-05 ENCOUNTER — Telehealth: Payer: Self-pay

## 2021-03-05 DIAGNOSIS — J329 Chronic sinusitis, unspecified: Secondary | ICD-10-CM

## 2021-03-05 DIAGNOSIS — J310 Chronic rhinitis: Secondary | ICD-10-CM

## 2021-03-05 DIAGNOSIS — J029 Acute pharyngitis, unspecified: Secondary | ICD-10-CM

## 2021-03-05 DIAGNOSIS — J301 Allergic rhinitis due to pollen: Secondary | ICD-10-CM

## 2021-03-05 MED ORDER — HYDROCODONE BIT-HOMATROP MBR 5-1.5 MG/5ML PO SOLN: 5.0000 mL | Freq: Four times a day (QID) | ORAL | 0 refills | Status: DC | PRN

## 2021-03-05 NOTE — Telephone Encounter (Signed)
Patient called stating she has tried otc medications for cough and congestion due to her being covid positive however they are not effective. Per Dr. Tobie Poet patient can try sudafed, flonase and sent a rx for hydromet cough syrup. Patient denied any SOB/CP/Difficulty breathing, O2 stats at home have ranged in the 90's aware that should her STATs drop below 90 to go to the hospital.

## 2021-03-08 ENCOUNTER — Other Ambulatory Visit: Payer: Self-pay | Admitting: Family Medicine

## 2021-03-08 ENCOUNTER — Other Ambulatory Visit: Payer: Self-pay

## 2021-03-08 ENCOUNTER — Ambulatory Visit
Admission: RE | Admit: 2021-03-08 | Discharge: 2021-03-08 | Disposition: A | Discharge status: Home / Self Care | Payer: BC Managed Care – PPO | Source: Ambulatory Visit | Attending: Family Medicine | Admitting: Family Medicine

## 2021-03-08 DIAGNOSIS — Z1231 Encounter for screening mammogram for malignant neoplasm of breast: Secondary | ICD-10-CM | POA: Diagnosis not present

## 2021-03-09 ENCOUNTER — Telehealth: Payer: Self-pay

## 2021-03-09 ENCOUNTER — Encounter: Payer: Self-pay | Admitting: Family Medicine

## 2021-03-09 NOTE — Telephone Encounter (Signed)
Attempted to call pt. Mychart message already sent by provider with information.   Royce Macadamia, [place] 03/09/21 2:54 PM

## 2021-03-09 NOTE — Telephone Encounter (Signed)
Pt calling to make provider aware Nancy Armstrong is on back order and is questioning what to do in meantime.   Nancy Armstrong, [place] 03/09/21 11:09 AM

## 2021-04-04 ENCOUNTER — Other Ambulatory Visit: Payer: Self-pay | Admitting: Family Medicine

## 2021-04-04 DIAGNOSIS — M79642 Pain in left hand: Secondary | ICD-10-CM

## 2021-04-04 DIAGNOSIS — M25511 Pain in right shoulder: Secondary | ICD-10-CM

## 2021-04-04 DIAGNOSIS — M79641 Pain in right hand: Secondary | ICD-10-CM

## 2021-04-04 DIAGNOSIS — M25512 Pain in left shoulder: Secondary | ICD-10-CM

## 2021-04-16 ENCOUNTER — Other Ambulatory Visit: Payer: Self-pay | Admitting: Family Medicine

## 2021-04-16 DIAGNOSIS — R41840 Attention and concentration deficit: Secondary | ICD-10-CM

## 2021-04-19 MED ORDER — AMPHETAMINE-DEXTROAMPHETAMINE 10 MG PO TABS: 10.0000 mg | ORAL_TABLET | Freq: Two times a day (BID) | ORAL | 0 refills | Status: DC

## 2021-04-29 ENCOUNTER — Other Ambulatory Visit: Payer: Self-pay | Admitting: Legal Medicine

## 2021-04-29 ENCOUNTER — Other Ambulatory Visit: Payer: Self-pay | Admitting: Surgical

## 2021-04-29 NOTE — Telephone Encounter (Signed)
She should discuss with PCP if she needs to continue ASA

## 2021-04-30 ENCOUNTER — Other Ambulatory Visit: Payer: Self-pay | Admitting: Physician Assistant

## 2021-05-30 ENCOUNTER — Other Ambulatory Visit: Payer: Self-pay | Admitting: Family Medicine

## 2021-05-30 DIAGNOSIS — Z0001 Encounter for general adult medical examination with abnormal findings: Secondary | ICD-10-CM

## 2021-06-02 ENCOUNTER — Other Ambulatory Visit: Payer: Self-pay | Admitting: Family Medicine

## 2021-07-02 ENCOUNTER — Other Ambulatory Visit: Payer: Self-pay | Admitting: Family Medicine

## 2021-08-06 ENCOUNTER — Other Ambulatory Visit: Payer: Self-pay | Admitting: Physician Assistant

## 2021-09-09 ENCOUNTER — Other Ambulatory Visit: Payer: Self-pay | Admitting: Family Medicine

## 2021-09-09 DIAGNOSIS — Z0001 Encounter for general adult medical examination with abnormal findings: Secondary | ICD-10-CM

## 2021-09-12 ENCOUNTER — Other Ambulatory Visit: Payer: Self-pay | Admitting: Family Medicine

## 2021-09-12 DIAGNOSIS — Z0001 Encounter for general adult medical examination with abnormal findings: Secondary | ICD-10-CM

## 2021-10-08 ENCOUNTER — Other Ambulatory Visit: Payer: Self-pay | Admitting: Family Medicine

## 2021-11-01 ENCOUNTER — Ambulatory Visit: Payer: Managed Care, Other (non HMO) | Admitting: Family Medicine

## 2021-11-01 ENCOUNTER — Encounter: Payer: Self-pay | Admitting: Family Medicine

## 2021-11-01 VITALS — BP 138/78 | HR 116 | Temp 97.3°F | Ht 64.0 in | Wt 217.0 lb

## 2021-11-01 DIAGNOSIS — Z6837 Body mass index (BMI) 37.0-37.9, adult: Secondary | ICD-10-CM

## 2021-11-01 DIAGNOSIS — R7301 Impaired fasting glucose: Secondary | ICD-10-CM

## 2021-11-01 DIAGNOSIS — R350 Frequency of micturition: Secondary | ICD-10-CM | POA: Diagnosis not present

## 2021-11-01 DIAGNOSIS — R7989 Other specified abnormal findings of blood chemistry: Secondary | ICD-10-CM

## 2021-11-01 DIAGNOSIS — M545 Low back pain, unspecified: Secondary | ICD-10-CM | POA: Diagnosis not present

## 2021-11-01 DIAGNOSIS — E782 Mixed hyperlipidemia: Secondary | ICD-10-CM

## 2021-11-01 DIAGNOSIS — E66812 Obesity, class 2: Secondary | ICD-10-CM | POA: Insufficient documentation

## 2021-11-01 LAB — POCT URINALYSIS DIPSTICK
Bilirubin, UA: NEGATIVE
Glucose, UA: NEGATIVE
Ketones, UA: NEGATIVE
Leukocytes, UA: NEGATIVE
Nitrite, UA: NEGATIVE
Protein, UA: NEGATIVE
Spec Grav, UA: >=1.03 — AB (ref 1.010–1.025)
Urobilinogen, UA: NEGATIVE E.U./dL — AB
pH, UA: 5.5 (ref 5.0–8.0)

## 2021-11-01 MED ORDER — SEMAGLUTIDE-WEIGHT MANAGEMENT 2.4 MG/0.75ML ~~LOC~~ SOAJ: 2.4000 mg | SUBCUTANEOUS | 0 refills | Status: DC

## 2021-11-01 MED ORDER — METHOCARBAMOL 500 MG PO TABS: 500.0000 mg | ORAL_TABLET | Freq: Four times a day (QID) | ORAL | 0 refills | Status: DC | PRN

## 2021-11-01 MED ORDER — SEMAGLUTIDE-WEIGHT MANAGEMENT 1 MG/0.5ML ~~LOC~~ SOAJ: 1.0000 mg | SUBCUTANEOUS | 0 refills | Status: AC

## 2021-11-01 MED ORDER — SEMAGLUTIDE-WEIGHT MANAGEMENT 0.5 MG/0.5ML ~~LOC~~ SOAJ: 0.5000 mg | SUBCUTANEOUS | 0 refills | Status: AC

## 2021-11-01 MED ORDER — SEMAGLUTIDE-WEIGHT MANAGEMENT 0.25 MG/0.5ML ~~LOC~~ SOAJ
0.2500 mg | SUBCUTANEOUS | 0 refills | Status: AC
Start: 2021-11-01 — End: 2021-11-29

## 2021-11-01 MED ORDER — SEMAGLUTIDE-WEIGHT MANAGEMENT 1.7 MG/0.75ML ~~LOC~~ SOAJ: 1.7000 mg | SUBCUTANEOUS | 0 refills | Status: AC

## 2021-11-01 NOTE — Assessment & Plan Note (Signed)
Await labs/testing for assessment and recommendations. Continue to work on eating a healthy diet and exercise.  Labs drawn today.   

## 2021-11-01 NOTE — Assessment & Plan Note (Signed)
Await labs/testing for assessment and recommendations. ?

## 2021-11-01 NOTE — Assessment & Plan Note (Signed)
Rx: start methocarbamol 500 mg four times a day as needed.  ?

## 2021-11-01 NOTE — Assessment & Plan Note (Signed)
Check a1c.  Recommend continue to work on eating healthy diet and exercise.  
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
## 2021-11-01 NOTE — Assessment & Plan Note (Addendum)
Start on wegovy.  ?Recommend continue to work on eating healthy diet and exercise. ?Comorbidities: prediabetes, hypelipidemia. ? ?

## 2021-11-01 NOTE — Assessment & Plan Note (Signed)
Urinalysis normal

## 2021-11-02 ENCOUNTER — Other Ambulatory Visit: Payer: Managed Care, Other (non HMO)

## 2021-11-02 ENCOUNTER — Other Ambulatory Visit: Payer: Self-pay | Admitting: Family Medicine

## 2021-11-02 DIAGNOSIS — E782 Mixed hyperlipidemia: Secondary | ICD-10-CM

## 2021-11-02 DIAGNOSIS — R7989 Other specified abnormal findings of blood chemistry: Secondary | ICD-10-CM

## 2021-11-02 DIAGNOSIS — R7301 Impaired fasting glucose: Secondary | ICD-10-CM

## 2021-11-02 DIAGNOSIS — M545 Low back pain, unspecified: Secondary | ICD-10-CM

## 2021-11-03 LAB — COMPREHENSIVE METABOLIC PANEL
ALT: 19 IU/L (ref 0–32)
AST: 14 IU/L (ref 0–40)
Albumin/Globulin Ratio: 1.8 (ref 1.2–2.2)
Albumin: 4.3 g/dL (ref 3.8–4.9)
Alkaline Phosphatase: 148 IU/L — ABNORMAL HIGH (ref 44–121)
BUN/Creatinine Ratio: 17 (ref 9–23)
BUN: 15 mg/dL (ref 6–24)
Bilirubin Total: 0.2 mg/dL (ref 0.0–1.2)
CO2: 24 mmol/L (ref 20–29)
Calcium: 9.5 mg/dL (ref 8.7–10.2)
Chloride: 102 mmol/L (ref 96–106)
Creatinine, Ser: 0.88 mg/dL (ref 0.57–1.00)
Globulin, Total: 2.4 g/dL (ref 1.5–4.5)
Glucose: 127 mg/dL — ABNORMAL HIGH (ref 70–99)
Potassium: 4.8 mmol/L (ref 3.5–5.2)
Sodium: 139 mmol/L (ref 134–144)
Total Protein: 6.7 g/dL (ref 6.0–8.5)
eGFR: 80 mL/min/{1.73_m2} (ref >59)

## 2021-11-03 LAB — CBC WITH DIFFERENTIAL/PLATELET
Basophils Absolute: 0.1 10*3/uL (ref 0.0–0.2)
Basos: 2 %
EOS (ABSOLUTE): 0.3 10*3/uL (ref 0.0–0.4)
Eos: 5 %
Hematocrit: 41.6 % (ref 34.0–46.6)
Hemoglobin: 13.8 g/dL (ref 11.1–15.9)
Immature Grans (Abs): 0 10*3/uL (ref 0.0–0.1)
Immature Granulocytes: 0 %
Lymphocytes Absolute: 2.1 10*3/uL (ref 0.7–3.1)
Lymphs: 32 %
MCH: 27.9 pg (ref 26.6–33.0)
MCHC: 33.2 g/dL (ref 31.5–35.7)
MCV: 84 fL (ref 79–97)
Monocytes Absolute: 0.6 10*3/uL (ref 0.1–0.9)
Monocytes: 8 %
Neutrophils Absolute: 3.5 10*3/uL (ref 1.4–7.0)
Neutrophils: 53 %
Platelets: 379 10*3/uL (ref 150–450)
RBC: 4.95 x10E6/uL (ref 3.77–5.28)
RDW: 14.5 % (ref 11.7–15.4)
WBC: 6.6 10*3/uL (ref 3.4–10.8)

## 2021-11-03 LAB — LIPID PANEL
Chol/HDL Ratio: 6.3 ratio — ABNORMAL HIGH (ref 0.0–4.4)
Cholesterol, Total: 275 mg/dL — ABNORMAL HIGH (ref 100–199)
HDL: 44 mg/dL (ref >39)
LDL Chol Calc (NIH): 167 mg/dL — ABNORMAL HIGH (ref 0–99)
Triglycerides: 333 mg/dL — ABNORMAL HIGH (ref 0–149)
VLDL Cholesterol Cal: 64 mg/dL — ABNORMAL HIGH (ref 5–40)

## 2021-11-03 LAB — HEMOGLOBIN A1C
Est. average glucose Bld gHb Est-mCnc: 131 mg/dL
Hgb A1c MFr Bld: 6.2 % — ABNORMAL HIGH (ref 4.8–5.6)

## 2021-11-03 LAB — CARDIOVASCULAR RISK ASSESSMENT

## 2021-11-03 LAB — TSH: TSH: 6.2 u[IU]/mL — ABNORMAL HIGH (ref 0.450–4.500)

## 2021-11-04 ENCOUNTER — Other Ambulatory Visit: Payer: Self-pay

## 2021-11-04 MED ORDER — ICOSAPENT ETHYL 1 G PO CAPS: 2.0000 g | ORAL_CAPSULE | Freq: Two times a day (BID) | ORAL | 1 refills | Status: DC

## 2021-11-04 MED ORDER — LEVOTHYROXINE SODIUM 50 MCG PO TABS: 50.0000 ug | ORAL_TABLET | Freq: Every day | ORAL | 1 refills | Status: DC

## 2021-11-04 MED ORDER — ROSUVASTATIN CALCIUM 10 MG PO TABS: 10.0000 mg | ORAL_TABLET | Freq: Every day | ORAL | 1 refills | Status: DC

## 2021-11-12 ENCOUNTER — Other Ambulatory Visit: Payer: Self-pay | Admitting: Family Medicine

## 2021-11-12 DIAGNOSIS — Z0001 Encounter for general adult medical examination with abnormal findings: Secondary | ICD-10-CM

## 2021-11-28 ENCOUNTER — Other Ambulatory Visit: Payer: Self-pay | Admitting: Family Medicine

## 2021-11-28 DIAGNOSIS — M545 Low back pain, unspecified: Secondary | ICD-10-CM

## 2021-12-18 ENCOUNTER — Other Ambulatory Visit: Payer: Self-pay | Admitting: Family Medicine

## 2021-12-23 ENCOUNTER — Other Ambulatory Visit: Payer: Self-pay | Admitting: Family Medicine

## 2021-12-23 DIAGNOSIS — M545 Low back pain, unspecified: Secondary | ICD-10-CM

## 2021-12-23 MED ORDER — METHOCARBAMOL 500 MG PO TABS: 500.0000 mg | ORAL_TABLET | Freq: Four times a day (QID) | ORAL | 2 refills | Status: DC | PRN

## 2021-12-25 ENCOUNTER — Other Ambulatory Visit: Payer: Self-pay | Admitting: Family Medicine

## 2021-12-25 DIAGNOSIS — Z0001 Encounter for general adult medical examination with abnormal findings: Secondary | ICD-10-CM

## 2022-01-02 IMAGING — MG MM DIGITAL SCREENING BILAT W/ TOMO AND CAD
7 series · 8 of 23 positions shown · non-contrast
Comparison: Previous exam(s).

CLINICAL DATA: Screening.

EXAM:
DIGITAL SCREENING BILATERAL MAMMOGRAM WITH TOMOSYNTHESIS AND CAD
TECHNIQUE: Bilateral screening digital craniocaudal and mediolateral oblique
mammograms were obtained. Bilateral screening digital breast
tomosynthesis was performed. The images were evaluated with
computer-aided detection.

[R CC synth-2D]
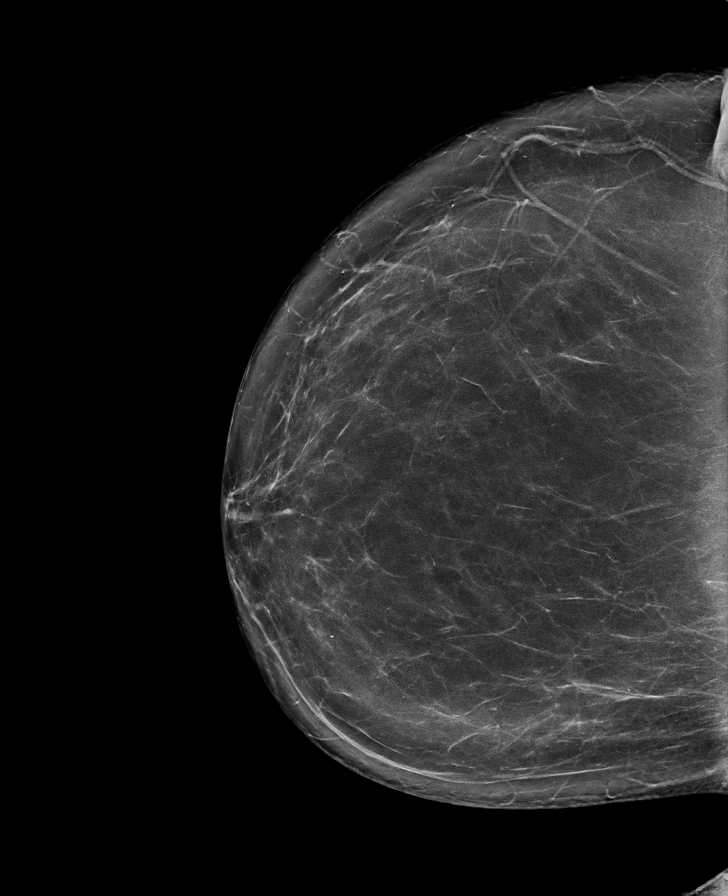

[L MLO synth-2D]
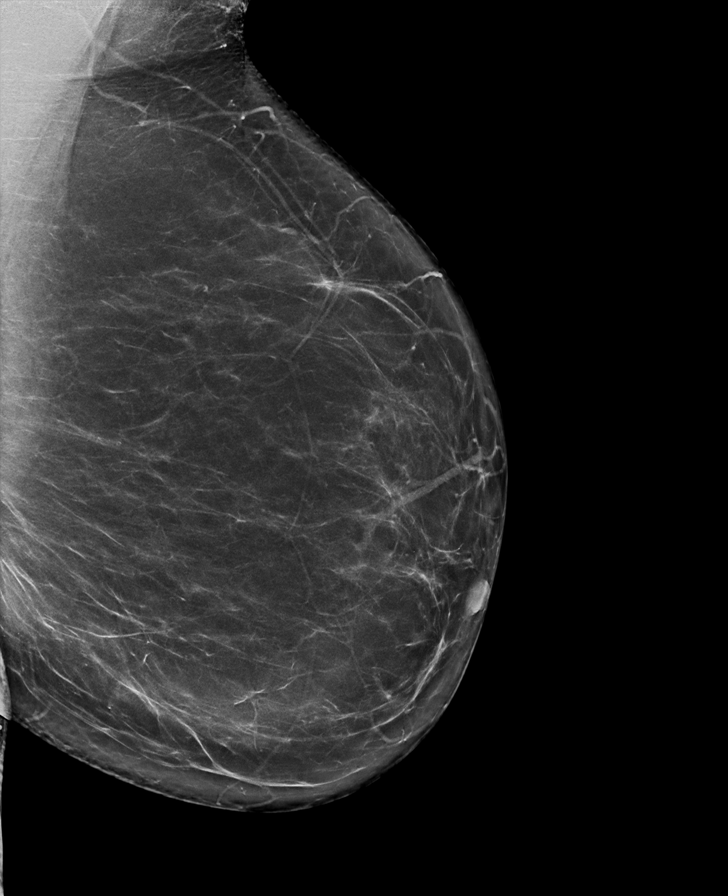

[L CC synth-2D]
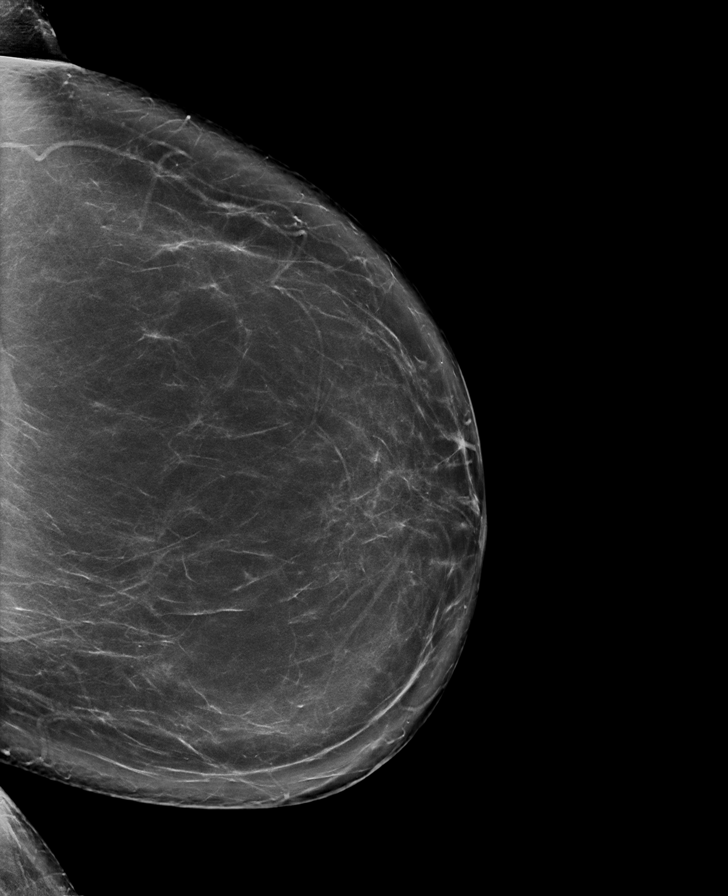

[L MLO tomo · 2 of 86 frames shown]
[frame 28/86]
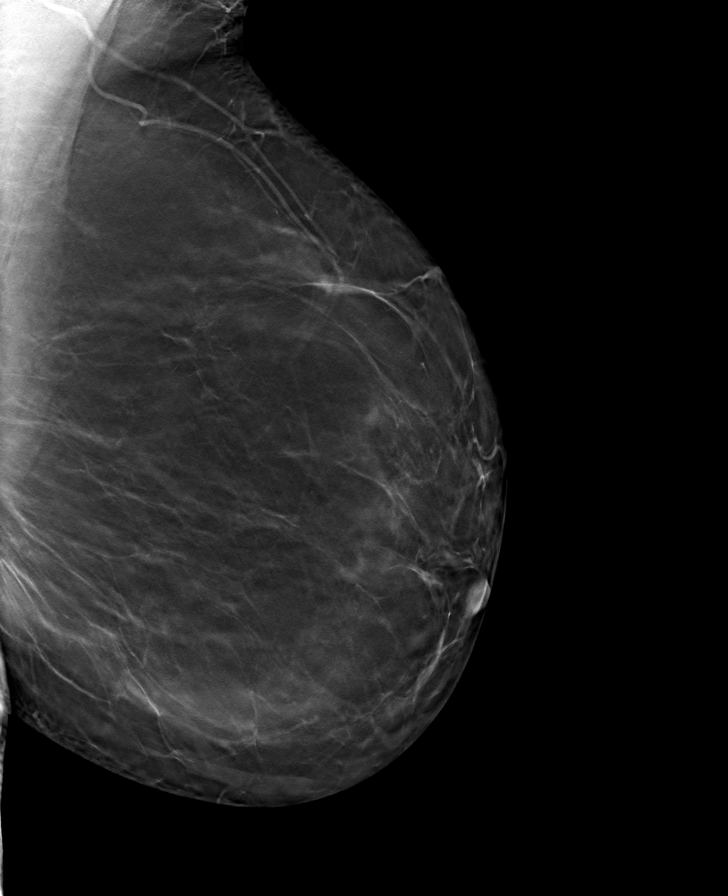
[frame 43/86]
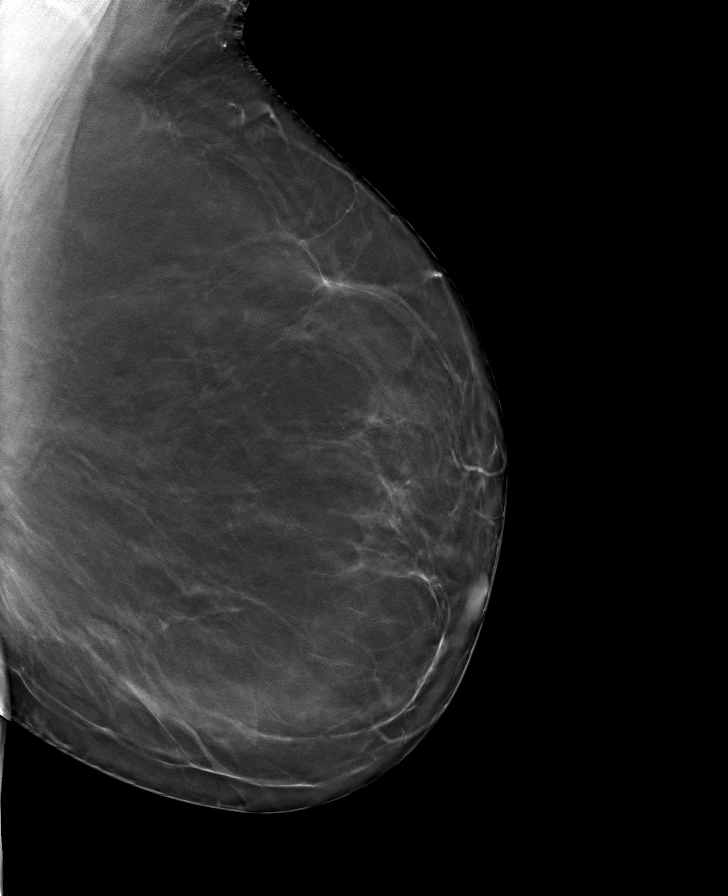

[R MLO tomo · tomo slice 45/90.0]
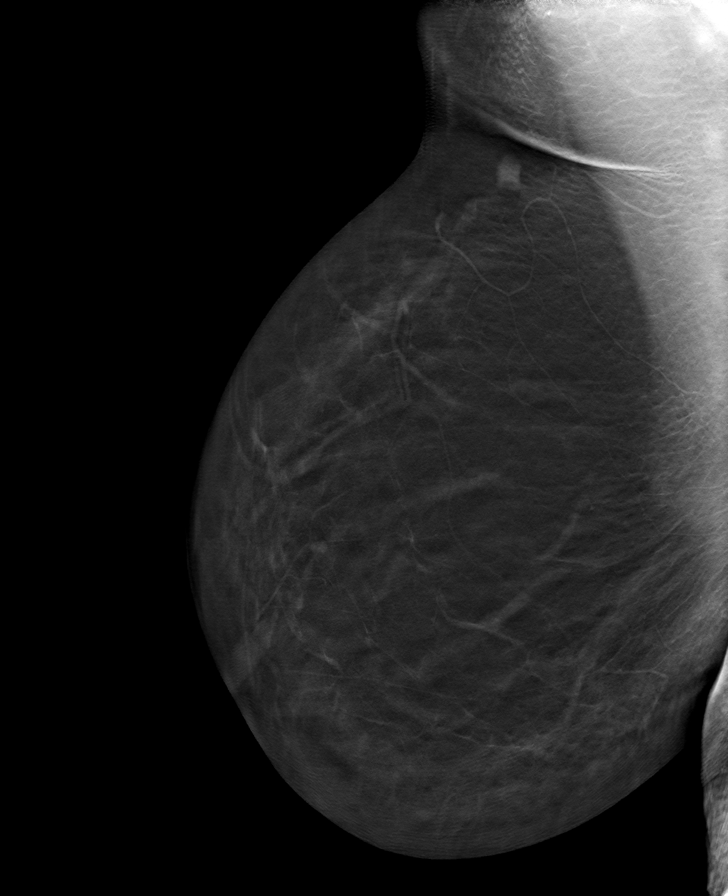

[R CC tomo · tomo slice 43/85.0]
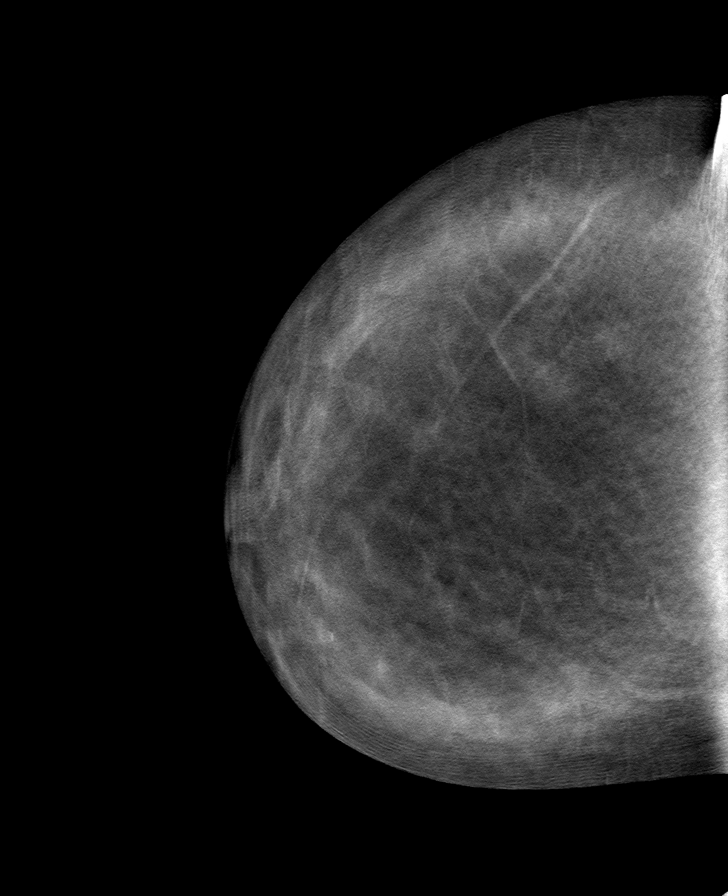

[L CC tomo · tomo slice 45/90.0]
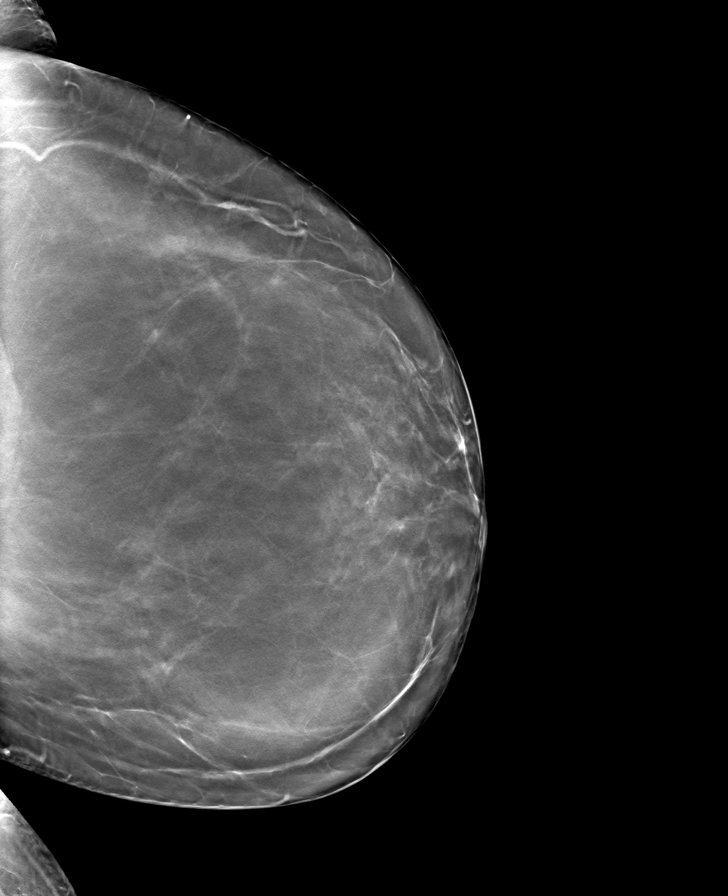

[8 of 23 positions shown; findings below may reference images not displayed]

ACR Breast Density Category b: There are scattered areas of
fibroglandular density.
FINDINGS: There are no findings suspicious for malignancy.
IMPRESSION: No mammographic evidence of malignancy. A result letter of this
screening mammogram will be mailed directly to the patient.

RECOMMENDATION:
Screening mammogram in one year. (Code:51-O-LD2)

BI-RADS CATEGORY  1: Negative.

## 2022-01-03 ENCOUNTER — Other Ambulatory Visit: Payer: Self-pay | Admitting: Family Medicine

## 2022-01-07 ENCOUNTER — Other Ambulatory Visit: Payer: Self-pay | Admitting: Family Medicine

## 2022-01-07 DIAGNOSIS — Z0001 Encounter for general adult medical examination with abnormal findings: Secondary | ICD-10-CM

## 2022-01-18 ENCOUNTER — Ambulatory Visit: Payer: Managed Care, Other (non HMO) | Admitting: Family Medicine

## 2022-01-18 VITALS — BP 110/80 | HR 84 | Temp 97.2°F | Resp 16 | Ht 64.0 in | Wt 221.0 lb

## 2022-01-18 DIAGNOSIS — G5603 Carpal tunnel syndrome, bilateral upper limbs: Secondary | ICD-10-CM

## 2022-01-18 DIAGNOSIS — F411 Generalized anxiety disorder: Secondary | ICD-10-CM | POA: Diagnosis not present

## 2022-01-18 DIAGNOSIS — Z0001 Encounter for general adult medical examination with abnormal findings: Secondary | ICD-10-CM

## 2022-01-18 MED ORDER — LORAZEPAM 1 MG PO TABS: 1.0000 mg | ORAL_TABLET | Freq: Every day | ORAL | 2 refills | Status: DC | PRN

## 2022-01-18 MED ORDER — NABUMETONE 750 MG PO TABS: 750.0000 mg | ORAL_TABLET | Freq: Two times a day (BID) | ORAL | 2 refills | Status: DC
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
## 2022-01-19 ENCOUNTER — Encounter: Payer: Self-pay | Admitting: Family Medicine

## 2022-01-19 DIAGNOSIS — G5603 Carpal tunnel syndrome, bilateral upper limbs: Secondary | ICD-10-CM | POA: Insufficient documentation

## 2022-01-19 NOTE — Patient Instructions (Signed)
Discontinue diclofenac.  Start relafen one twice a day. Recommend carpal tunnel braces BL.  Recommend rest wrists.

## 2022-01-19 NOTE — Assessment & Plan Note (Signed)
Discontinue diclofenac.  Start relafen one twice a day. Recommend carpal tunnel braces BL.  Recommend rest wrists.

## 2022-01-20 ENCOUNTER — Telehealth: Payer: Self-pay

## 2022-01-20 NOTE — Telephone Encounter (Signed)
PA submitted and approved via covermymeds for [facility]

## 2022-01-21 ENCOUNTER — Other Ambulatory Visit: Payer: Self-pay | Admitting: Family Medicine

## 2022-01-26 ENCOUNTER — Other Ambulatory Visit: Payer: Self-pay | Admitting: Family Medicine

## 2022-02-02 ENCOUNTER — Ambulatory Visit: Payer: Managed Care, Other (non HMO) | Admitting: Family Medicine
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
## 2022-02-15 ENCOUNTER — Other Ambulatory Visit: Payer: Self-pay | Admitting: Family Medicine

## 2022-02-15 DIAGNOSIS — M545 Low back pain, unspecified: Secondary | ICD-10-CM

## 2022-03-11 ENCOUNTER — Other Ambulatory Visit: Payer: Self-pay | Admitting: Family Medicine

## 2022-03-11 MED ORDER — CLONAZEPAM 0.5 MG PO TABS: 0.5000 mg | ORAL_TABLET | Freq: Two times a day (BID) | ORAL | 1 refills | Status: DC | PRN

## 2022-04-07 ENCOUNTER — Other Ambulatory Visit: Payer: Self-pay | Admitting: Family Medicine

## 2022-04-07 DIAGNOSIS — Z0001 Encounter for general adult medical examination with abnormal findings: Secondary | ICD-10-CM

## 2022-04-15 ENCOUNTER — Ambulatory Visit: Payer: Managed Care, Other (non HMO) | Admitting: Physician Assistant

## 2022-04-15 ENCOUNTER — Encounter: Payer: Self-pay | Admitting: Physician Assistant

## 2022-04-15 VITALS — BP 116/70 | HR 89 | Temp 97.4°F | Ht 64.0 in | Wt 224.4 lb

## 2022-04-15 DIAGNOSIS — R41840 Attention and concentration deficit: Secondary | ICD-10-CM | POA: Insufficient documentation

## 2022-04-15 DIAGNOSIS — J060 Acute laryngopharyngitis: Secondary | ICD-10-CM | POA: Insufficient documentation

## 2022-04-15 LAB — POC COVID19 BINAXNOW: SARS Coronavirus 2 Ag: NEGATIVE

## 2022-04-15 MED ORDER — AMPHETAMINE-DEXTROAMPHETAMINE 10 MG PO TABS: 10.0000 mg | ORAL_TABLET | Freq: Two times a day (BID) | ORAL | 0 refills | Status: DC

## 2022-04-15 MED ORDER — CEFDINIR 300 MG PO CAPS: 300.0000 mg | ORAL_CAPSULE | Freq: Two times a day (BID) | ORAL | 0 refills | Status: DC
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
## 2022-04-18 ENCOUNTER — Other Ambulatory Visit: Payer: Self-pay | Admitting: Family Medicine

## 2022-04-27 ENCOUNTER — Other Ambulatory Visit: Payer: Self-pay | Admitting: Family Medicine

## 2022-04-29 ENCOUNTER — Other Ambulatory Visit: Payer: Self-pay | Admitting: Family Medicine

## 2022-04-29 ENCOUNTER — Ambulatory Visit: Payer: Managed Care, Other (non HMO) | Admitting: Family Medicine

## 2022-04-29 VITALS — BP 118/80 | HR 95 | Temp 97.2°F | Ht 64.0 in | Wt 221.0 lb

## 2022-04-29 DIAGNOSIS — R41840 Attention and concentration deficit: Secondary | ICD-10-CM | POA: Diagnosis not present

## 2022-04-29 DIAGNOSIS — F411 Generalized anxiety disorder: Secondary | ICD-10-CM | POA: Diagnosis not present

## 2022-04-29 DIAGNOSIS — G5603 Carpal tunnel syndrome, bilateral upper limbs: Secondary | ICD-10-CM

## 2022-04-29 DIAGNOSIS — R7301 Impaired fasting glucose: Secondary | ICD-10-CM | POA: Diagnosis not present

## 2022-04-29 DIAGNOSIS — E782 Mixed hyperlipidemia: Secondary | ICD-10-CM | POA: Diagnosis not present

## 2022-04-29 DIAGNOSIS — M797 Fibromyalgia: Secondary | ICD-10-CM

## 2022-04-29 DIAGNOSIS — R7989 Other specified abnormal findings of blood chemistry: Secondary | ICD-10-CM

## 2022-04-29 DIAGNOSIS — M545 Low back pain, unspecified: Secondary | ICD-10-CM

## 2022-04-29 MED ORDER — METHOCARBAMOL 500 MG PO TABS: 1000.0000 mg | ORAL_TABLET | Freq: Four times a day (QID) | ORAL | 2 refills | Status: DC | PRN

## 2022-04-29 MED ORDER — METHOCARBAMOL 1000 MG PO TABS: 1000.0000 mg | ORAL_TABLET | Freq: Four times a day (QID) | ORAL | 0 refills | Status: DC

## 2022-04-29 NOTE — Progress Notes (Unsigned)
   Established Patient Office Visit  Subjective   Patient ID: Nancy Armstrong, female    DOB: [birth year]  Age: 50 y.o. MRN: [id number]  Chief Complaint  Patient presents with   Hyperlipidemia    HPI ADHD Adderal 10 mg twice daily  Hypothyroidism Synthroid 50 mg daily Hyperlipdemia Crestor 5 mg daily Anxiety Cymbalta 60 mg daily bedtime  {History (Optional):23778}  Review of Systems  Constitutional:  Positive for malaise/fatigue. Negative for chills and fever.  HENT:  Negative for congestion, ear pain, sinus pain and sore throat.   Respiratory:  Negative for cough and shortness of breath.   Cardiovascular:  Negative for chest pain, palpitations and leg swelling.  Gastrointestinal:  Negative for abdominal pain, constipation, diarrhea, nausea and vomiting.  Genitourinary:  Negative for dysuria and frequency.  Musculoskeletal:  Positive for back pain. Negative for joint pain and myalgias.  Skin:  Negative for rash.  Neurological:  Negative for dizziness, weakness and headaches.      Objective:     BP 118/80   Pulse 95   Temp (!) 97.2 F (36.2 C)   Ht '5\' 4"'$  (1.626 m)   Wt 221 lb (100.2 kg)   SpO2 99%   BMI 37.93 kg/m  {Vitals History (Optional):23777}  Physical Exam   No results found for any visits on 04/29/22.  {Labs (Optional):23779}  The 10-year ASCVD risk score (Arnett DK, et al., 2019) is: 2.3%    Assessment & Plan:   Problem List Items Addressed This Visit       Endocrine   Impaired fasting glucose   Relevant Orders   Hemoglobin A1c     Other   Fibromyalgia   Relevant Medications   methocarbamol 1000 MG TABS   Other Relevant Orders   Phosphorus   Magnesium   Mixed hyperlipidemia   Relevant Orders   Lipid panel   Comprehensive metabolic panel   CBC with Differential/Platelet   Generalized anxiety disorder   Abnormal TSH   Relevant Orders   T4, free   TSH   Attention deficit - Primary   Total time spent on today's visit was greater than  30 minutes, including both face-to-face time and nonface-to-face time personally spent on review of chart (labs and imaging), discussing labs and goals, discussing further work-up, treatment options, referrals to specialist if needed, reviewing outside records of pertinent, answering patient's questions, and coordinating care.  No follow-ups on file.    Rochel Brome, MD  Geralynn Ochs I Leal-Borjas,acting as a scribe for Rochel Brome, MD.,have documented all relevant documentation on the behalf of Rochel Brome, MD,as directed by  Rochel Brome, MD while in the presence of Rochel Brome, MD.
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
## 2022-04-30 LAB — CBC WITH DIFFERENTIAL/PLATELET
Basophils Absolute: 0.1 10*3/uL (ref 0.0–0.2)
Basos: 2 %
EOS (ABSOLUTE): 0.3 10*3/uL (ref 0.0–0.4)
Eos: 4 %
Hematocrit: 43 % (ref 34.0–46.6)
Hemoglobin: 14.2 g/dL (ref 11.1–15.9)
Immature Grans (Abs): 0 10*3/uL (ref 0.0–0.1)
Immature Granulocytes: 0 %
Lymphocytes Absolute: 1.6 10*3/uL (ref 0.7–3.1)
Lymphs: 24 %
MCH: 28.3 pg (ref 26.6–33.0)
MCHC: 33 g/dL (ref 31.5–35.7)
MCV: 86 fL (ref 79–97)
Monocytes Absolute: 0.5 10*3/uL (ref 0.1–0.9)
Monocytes: 7 %
Neutrophils Absolute: 4.4 10*3/uL (ref 1.4–7.0)
Neutrophils: 63 %
Platelets: 358 10*3/uL (ref 150–450)
RBC: 5.02 x10E6/uL (ref 3.77–5.28)
RDW: 14.3 % (ref 11.7–15.4)
WBC: 6.8 10*3/uL (ref 3.4–10.8)

## 2022-04-30 LAB — COMPREHENSIVE METABOLIC PANEL
ALT: 21 IU/L (ref 0–32)
AST: 17 IU/L (ref 0–40)
Albumin/Globulin Ratio: 1.7 (ref 1.2–2.2)
Albumin: 4.5 g/dL (ref 3.8–4.9)
Alkaline Phosphatase: 183 IU/L — ABNORMAL HIGH (ref 44–121)
BUN/Creatinine Ratio: 11 (ref 9–23)
BUN: 10 mg/dL (ref 6–24)
Bilirubin Total: 0.4 mg/dL (ref 0.0–1.2)
CO2: 27 mmol/L (ref 20–29)
Calcium: 9.5 mg/dL (ref 8.7–10.2)
Chloride: 101 mmol/L (ref 96–106)
Creatinine, Ser: 0.88 mg/dL (ref 0.57–1.00)
Globulin, Total: 2.6 g/dL (ref 1.5–4.5)
Glucose: 114 mg/dL — ABNORMAL HIGH (ref 70–99)
Potassium: 4.6 mmol/L (ref 3.5–5.2)
Sodium: 142 mmol/L (ref 134–144)
Total Protein: 7.1 g/dL (ref 6.0–8.5)
eGFR: 80 mL/min/{1.73_m2} (ref >59)

## 2022-04-30 LAB — HEMOGLOBIN A1C
Est. average glucose Bld gHb Est-mCnc: 134 mg/dL
Hgb A1c MFr Bld: 6.3 % — ABNORMAL HIGH (ref 4.8–5.6)

## 2022-04-30 LAB — CARDIOVASCULAR RISK ASSESSMENT

## 2022-04-30 LAB — T4, FREE: Free T4: 0.97 ng/dL (ref 0.82–1.77)

## 2022-04-30 LAB — LIPID PANEL
Chol/HDL Ratio: 3.8 ratio (ref 0.0–4.4)
Cholesterol, Total: 184 mg/dL (ref 100–199)
HDL: 49 mg/dL (ref >39)
LDL Chol Calc (NIH): 83 mg/dL (ref 0–99)
Triglycerides: 317 mg/dL — ABNORMAL HIGH (ref 0–149)
VLDL Cholesterol Cal: 52 mg/dL — ABNORMAL HIGH (ref 5–40)

## 2022-04-30 LAB — PHOSPHORUS: Phosphorus: 3.8 mg/dL (ref 3.0–4.3)

## 2022-04-30 LAB — TSH: TSH: 3 u[IU]/mL (ref 0.450–4.500)

## 2022-04-30 LAB — MAGNESIUM: Magnesium: 2.2 mg/dL (ref 1.6–2.3)

## 2022-05-01 NOTE — Progress Notes (Signed)
Blood count normal.  Liver function normal.  Kidney function normal.  Thyroid function normal.  Cholesterol: trigs 317. LDL good. See if patient is taking vascepa 1 gm 2 capsules twice daily.  HBA1C: 6.3. getting closer to diabetes. Recommend add metformin 500 mg daily. Magnesium and phosphorus normal.  Follow up in 3 months fasting.

## 2022-05-02 ENCOUNTER — Other Ambulatory Visit: Payer: Self-pay

## 2022-05-02 ENCOUNTER — Other Ambulatory Visit: Payer: Self-pay | Admitting: Family Medicine

## 2022-05-02 DIAGNOSIS — M545 Low back pain, unspecified: Secondary | ICD-10-CM

## 2022-05-02 MED ORDER — METHOCARBAMOL 500 MG PO TABS: 1000.0000 mg | ORAL_TABLET | Freq: Four times a day (QID) | ORAL | 2 refills | Status: DC | PRN

## 2022-05-02 MED ORDER — ICOSAPENT ETHYL 1 G PO CAPS: 2.0000 g | ORAL_CAPSULE | Freq: Two times a day (BID) | ORAL | 1 refills | Status: DC

## 2022-05-02 MED ORDER — SEMAGLUTIDE-WEIGHT MANAGEMENT 2.4 MG/0.75ML ~~LOC~~ SOAJ: 2.4000 mg | SUBCUTANEOUS | 0 refills | Status: DC

## 2022-05-02 NOTE — Telephone Encounter (Signed)
Patient only wants the [facility] to go to [place].

## 2022-05-05 ENCOUNTER — Other Ambulatory Visit: Payer: Self-pay

## 2022-05-05 MED ORDER — SEMAGLUTIDE-WEIGHT MANAGEMENT 2.4 MG/0.75ML ~~LOC~~ SOAJ: 2.4000 mg | SUBCUTANEOUS | 0 refills | Status: AC

## 2022-05-06 ENCOUNTER — Other Ambulatory Visit: Payer: Self-pay | Admitting: Family Medicine

## 2022-05-06 NOTE — Assessment & Plan Note (Signed)
Well controlled.  No changes to medicines. Crestor 5 mg daily. Continue to work on eating a healthy diet and exercise.  Labs drawn today.

## 2022-05-06 NOTE — Assessment & Plan Note (Signed)
The current medical regimen is effective;  continue present plan and medications.  

## 2022-05-06 NOTE — Assessment & Plan Note (Addendum)
The current medical regimen is effective;  continue present plan and medications. Cymbalta 60 mg daily bedtime

## 2022-05-06 NOTE — Assessment & Plan Note (Addendum)
The current medical regimen is effective;  continue present plan and medications. Adderal 10 mg twice daily

## 2022-05-06 NOTE — Assessment & Plan Note (Signed)
Previously well controlled Continue Synthroid at current dose  Recheck TSH and adjust Synthroid as indicated   

## 2022-05-06 NOTE — Assessment & Plan Note (Signed)
Hemoglobin A1c 6.3%, 3 month avg of blood sugars, is in prediabetic range.  In order to prevent progression to diabetes, recommend low carb diet and regular exercise  

## 2022-05-09 ENCOUNTER — Encounter: Payer: Self-pay | Admitting: Family Medicine

## 2022-06-01 ENCOUNTER — Other Ambulatory Visit: Payer: Self-pay | Admitting: Family Medicine

## 2022-06-27 ENCOUNTER — Other Ambulatory Visit: Payer: Self-pay | Admitting: Family Medicine

## 2022-07-04 ENCOUNTER — Other Ambulatory Visit: Payer: Self-pay | Admitting: Family Medicine

## 2022-07-22 ENCOUNTER — Other Ambulatory Visit: Payer: Self-pay | Admitting: Family Medicine

## 2022-07-22 DIAGNOSIS — Z0001 Encounter for general adult medical examination with abnormal findings: Secondary | ICD-10-CM

## 2022-07-26 ENCOUNTER — Other Ambulatory Visit: Payer: Self-pay | Admitting: Family Medicine

## 2022-07-26 DIAGNOSIS — G5603 Carpal tunnel syndrome, bilateral upper limbs: Secondary | ICD-10-CM

## 2022-07-27 ENCOUNTER — Telehealth: Payer: Self-pay

## 2022-07-27 NOTE — Telephone Encounter (Signed)
-----  Message from Rochel Brome, MD sent at 07/26/2022  8:47 PM EST ----- Regarding: pt needs a fasting followup Patient needs a fasting follow up. Dr. Tobie Poet

## 2022-07-27 NOTE — Telephone Encounter (Signed)
Her appointment has been scheduled. Patient is coming in prior for blood work.

## 2022-08-04 ENCOUNTER — Other Ambulatory Visit: Payer: Self-pay

## 2022-08-04 DIAGNOSIS — R7301 Impaired fasting glucose: Secondary | ICD-10-CM

## 2022-08-04 DIAGNOSIS — E782 Mixed hyperlipidemia: Secondary | ICD-10-CM

## 2022-08-05 ENCOUNTER — Other Ambulatory Visit: Payer: Managed Care, Other (non HMO)

## 2022-08-05 DIAGNOSIS — E782 Mixed hyperlipidemia: Secondary | ICD-10-CM

## 2022-08-05 DIAGNOSIS — R7301 Impaired fasting glucose: Secondary | ICD-10-CM

## 2022-08-06 LAB — COMPREHENSIVE METABOLIC PANEL
ALT: 12 IU/L (ref 0–32)
AST: 14 IU/L (ref 0–40)
Albumin/Globulin Ratio: 2 (ref 1.2–2.2)
Albumin: 4.4 g/dL (ref 3.8–4.9)
Alkaline Phosphatase: 158 IU/L — ABNORMAL HIGH (ref 44–121)
BUN/Creatinine Ratio: 17 (ref 9–23)
BUN: 16 mg/dL (ref 6–24)
Bilirubin Total: 0.2 mg/dL (ref 0.0–1.2)
CO2: 25 mmol/L (ref 20–29)
Calcium: 9.2 mg/dL (ref 8.7–10.2)
Chloride: 102 mmol/L (ref 96–106)
Creatinine, Ser: 0.95 mg/dL (ref 0.57–1.00)
Globulin, Total: 2.2 g/dL (ref 1.5–4.5)
Glucose: 121 mg/dL — ABNORMAL HIGH (ref 70–99)
Potassium: 4.6 mmol/L (ref 3.5–5.2)
Sodium: 142 mmol/L (ref 134–144)
Total Protein: 6.6 g/dL (ref 6.0–8.5)
eGFR: 73 mL/min/{1.73_m2} (ref >59)

## 2022-08-06 LAB — LIPID PANEL
Chol/HDL Ratio: 3.1 ratio (ref 0.0–4.4)
Cholesterol, Total: 172 mg/dL (ref 100–199)
HDL: 55 mg/dL (ref >39)
LDL Chol Calc (NIH): 82 mg/dL (ref 0–99)
Triglycerides: 213 mg/dL — ABNORMAL HIGH (ref 0–149)
VLDL Cholesterol Cal: 35 mg/dL (ref 5–40)

## 2022-08-06 LAB — CBC WITH DIFF/PLATELET
Basophils Absolute: 0.1 10*3/uL (ref 0.0–0.2)
Basos: 2 %
EOS (ABSOLUTE): 0.2 10*3/uL (ref 0.0–0.4)
Eos: 4 %
Hematocrit: 41 % (ref 34.0–46.6)
Hemoglobin: 13.3 g/dL (ref 11.1–15.9)
Immature Grans (Abs): 0 10*3/uL (ref 0.0–0.1)
Immature Granulocytes: 0 %
Lymphocytes Absolute: 1.7 10*3/uL (ref 0.7–3.1)
Lymphs: 25 %
MCH: 28.4 pg (ref 26.6–33.0)
MCHC: 32.4 g/dL (ref 31.5–35.7)
MCV: 88 fL (ref 79–97)
Monocytes Absolute: 0.5 10*3/uL (ref 0.1–0.9)
Monocytes: 7 %
Neutrophils Absolute: 4.3 10*3/uL (ref 1.4–7.0)
Neutrophils: 62 %
Platelets: 317 10*3/uL (ref 150–450)
RBC: 4.68 x10E6/uL (ref 3.77–5.28)
RDW: 14.6 % (ref 11.7–15.4)
WBC: 6.8 10*3/uL (ref 3.4–10.8)

## 2022-08-06 LAB — HEMOGLOBIN A1C
Est. average glucose Bld gHb Est-mCnc: 134 mg/dL
Hgb A1c MFr Bld: 6.3 % — ABNORMAL HIGH (ref 4.8–5.6)

## 2022-08-06 LAB — CARDIOVASCULAR RISK ASSESSMENT

## 2022-08-08 ENCOUNTER — Encounter: Payer: Self-pay | Admitting: Family Medicine

## 2022-08-09 ENCOUNTER — Other Ambulatory Visit: Payer: Self-pay | Admitting: Family Medicine

## 2022-08-09 ENCOUNTER — Encounter: Payer: Self-pay | Admitting: Family Medicine

## 2022-08-09 ENCOUNTER — Ambulatory Visit: Payer: Managed Care, Other (non HMO) | Admitting: Family Medicine

## 2022-08-09 VITALS — BP 110/76 | HR 96 | Temp 97.5°F | Ht 64.0 in | Wt 216.0 lb

## 2022-08-09 DIAGNOSIS — Z114 Encounter for screening for human immunodeficiency virus [HIV]: Secondary | ICD-10-CM

## 2022-08-09 DIAGNOSIS — R7301 Impaired fasting glucose: Secondary | ICD-10-CM | POA: Diagnosis not present

## 2022-08-09 DIAGNOSIS — Z1159 Encounter for screening for other viral diseases: Secondary | ICD-10-CM

## 2022-08-09 DIAGNOSIS — E782 Mixed hyperlipidemia: Secondary | ICD-10-CM | POA: Diagnosis not present

## 2022-08-09 DIAGNOSIS — Z6837 Body mass index (BMI) 37.0-37.9, adult: Secondary | ICD-10-CM

## 2022-08-09 DIAGNOSIS — Z23 Encounter for immunization: Secondary | ICD-10-CM | POA: Diagnosis not present

## 2022-08-09 DIAGNOSIS — F5102 Adjustment insomnia: Secondary | ICD-10-CM

## 2022-08-09 DIAGNOSIS — M797 Fibromyalgia: Secondary | ICD-10-CM

## 2022-08-09 DIAGNOSIS — F411 Generalized anxiety disorder: Secondary | ICD-10-CM | POA: Diagnosis not present

## 2022-08-09 DIAGNOSIS — R41840 Attention and concentration deficit: Secondary | ICD-10-CM

## 2022-08-09 MED ORDER — ZEPBOUND 2.5 MG/0.5ML ~~LOC~~ SOAJ: 2.5000 mg | SUBCUTANEOUS | 0 refills | Status: DC

## 2022-08-09 NOTE — Patient Instructions (Addendum)
ZEPBOUND 2.5 mg weekly for 4 weeks.  Send a MyChart message or call to let us know how you are doing on the medicine. Track weight. Consider food log if this helps you.
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
## 2022-08-11 ENCOUNTER — Other Ambulatory Visit: Payer: Self-pay | Admitting: Family Medicine

## 2022-08-11 ENCOUNTER — Other Ambulatory Visit: Payer: Self-pay | Admitting: Physician Assistant

## 2022-08-11 ENCOUNTER — Encounter: Payer: Self-pay | Admitting: Family Medicine

## 2022-08-11 DIAGNOSIS — R41840 Attention and concentration deficit: Secondary | ICD-10-CM

## 2022-08-12 MED ORDER — PROMETHAZINE HCL 25 MG PO TABS: 25.0000 mg | ORAL_TABLET | Freq: Three times a day (TID) | ORAL | 0 refills | Status: DC | PRN

## 2022-08-12 MED ORDER — AMPHETAMINE-DEXTROAMPHETAMINE 10 MG PO TABS: 10.0000 mg | ORAL_TABLET | Freq: Two times a day (BID) | ORAL | 0 refills | Status: DC

## 2022-08-13 DIAGNOSIS — Z23 Encounter for immunization: Secondary | ICD-10-CM | POA: Insufficient documentation

## 2022-08-13 NOTE — Assessment & Plan Note (Signed)
The current medical regimen is effective;  continue present plan and medications.  Continue Cymbalta 60 mg daily bedtime, Klonopin 0.5 mg twice daily as needed

## 2022-08-13 NOTE — Assessment & Plan Note (Addendum)
Comorbidities: hyperlipidemia and prediabetes.  Start on zepbound titration. Recommend continue to work on eating healthy diet and exercise.

## 2022-08-13 NOTE — Assessment & Plan Note (Signed)
Recommend continue to work on eating healthy diet and exercise.  

## 2022-08-13 NOTE — Assessment & Plan Note (Signed)
The current medical regimen is effective;  continue present plan and medications. Continue ambien 10 mg before bed.

## 2022-08-13 NOTE — Assessment & Plan Note (Signed)
Well controlled.  No changes to medicines. Continue Crestor 5 mg daily, vascepa 1 gm twice daily. Continue to work on eating a healthy diet and exercise.  Labs drawn today.

## 2022-08-13 NOTE — Assessment & Plan Note (Signed)
The current medical regimen is effective;  continue present plan and medications. Adderal 10 mg twice daily

## 2022-08-13 NOTE — Assessment & Plan Note (Signed)
Fair control The current medical regimen is effective;  continue present plan and medications. Continue  cymbalta, amitriptyline, nabumetone 750 mg one twice daily. robaxin 750 mg one 2 every 6 hours as needed... only takes approximately once daily.

## 2022-08-17 ENCOUNTER — Other Ambulatory Visit: Payer: Self-pay | Admitting: Family Medicine

## 2022-08-17 MED ORDER — WEGOVY 0.25 MG/0.5ML ~~LOC~~ SOAJ: 0.2500 mg | SUBCUTANEOUS | 0 refills | Status: DC

## 2022-09-03 ENCOUNTER — Other Ambulatory Visit: Payer: Self-pay | Admitting: Family Medicine

## 2022-09-03 MED ORDER — VALACYCLOVIR HCL 1 G PO TABS: 2000.0000 mg | ORAL_TABLET | Freq: Two times a day (BID) | ORAL | 0 refills | Status: DC

## 2022-09-04 ENCOUNTER — Other Ambulatory Visit: Payer: Self-pay | Admitting: Family Medicine

## 2022-09-14 ENCOUNTER — Other Ambulatory Visit: Payer: Self-pay | Admitting: Family Medicine

## 2022-09-14 DIAGNOSIS — R7301 Impaired fasting glucose: Secondary | ICD-10-CM

## 2022-09-20 ENCOUNTER — Other Ambulatory Visit: Payer: Self-pay | Admitting: Family Medicine

## 2022-09-22 ENCOUNTER — Other Ambulatory Visit: Payer: Self-pay

## 2022-09-22 DIAGNOSIS — R7301 Impaired fasting glucose: Secondary | ICD-10-CM

## 2022-09-22 NOTE — Telephone Encounter (Signed)
Patient called with Refill for zepbound. She stated that she has been on this for the past month now and is doing well on it. She was switched from the wegovy to the zepbound due to the patient not being able to find wegovy.

## 2022-09-23 MED ORDER — ZEPBOUND 2.5 MG/0.5ML ~~LOC~~ SOAJ: 2.5000 mg | SUBCUTANEOUS | 0 refills | Status: DC

## 2022-09-23 MED ORDER — ROSUVASTATIN CALCIUM 10 MG PO TABS: 10.0000 mg | ORAL_TABLET | Freq: Every day | ORAL | 0 refills | Status: DC

## 2022-10-03 ENCOUNTER — Ambulatory Visit: Payer: Managed Care, Other (non HMO) | Admitting: Family Medicine

## 2022-10-03 ENCOUNTER — Encounter: Payer: Self-pay | Admitting: Family Medicine

## 2022-10-03 VITALS — BP 132/86 | HR 96 | Temp 97.2°F | Ht 64.0 in | Wt 212.0 lb

## 2022-10-03 DIAGNOSIS — M25511 Pain in right shoulder: Secondary | ICD-10-CM

## 2022-10-03 DIAGNOSIS — M2559 Pain in other specified joint: Secondary | ICD-10-CM | POA: Diagnosis not present

## 2022-10-03 DIAGNOSIS — G8929 Other chronic pain: Secondary | ICD-10-CM

## 2022-10-03 DIAGNOSIS — M2550 Pain in unspecified joint: Secondary | ICD-10-CM | POA: Insufficient documentation

## 2022-10-03 MED ORDER — TRIAMCINOLONE ACETONIDE 40 MG/ML IJ SUSP: 40.0000 mg | Freq: Once | INTRAMUSCULAR | Status: DC
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
## 2022-10-03 NOTE — Assessment & Plan Note (Signed)
Risks were discussed including bleeding, infection, increase in sugars if diabetic, atrophy at site of injection, and increased pain.  After consent was obtained, using sterile technique the right shoulder was prepped with alcohol.  The joint was entered posteriorly and  Kenalog 40 mg and 5 ml plain Lidocaine was then injected and the needle withdrawn.  The procedure was well tolerated.   The patient is asked to continue to rest the joint for a few more days before resuming regular activities.  It may be more painful for the first 1-2 days.  Watch for fever, or increased swelling or persistent pain in the joint. Call or return to clinic prn if such symptoms occur or there is failure to improve as anticipated. 

## 2022-10-11 ENCOUNTER — Other Ambulatory Visit: Payer: Self-pay | Admitting: Family Medicine

## 2022-10-11 DIAGNOSIS — R7301 Impaired fasting glucose: Secondary | ICD-10-CM

## 2022-10-17 DIAGNOSIS — R079 Chest pain, unspecified: Secondary | ICD-10-CM

## 2022-10-18 ENCOUNTER — Telehealth: Payer: Self-pay

## 2022-10-18 NOTE — Telephone Encounter (Signed)
Appointment has been scheduled for tomorrow at 7:30. Patient notified.

## 2022-10-18 NOTE — Telephone Encounter (Signed)
Hospital: [facility] Admitted: 10/16/2022 Discharged: 10/17/2022 Follow-up within: 1-2 weeks. Diagnosed: unknown- patient stated they thought it may have been GERD.  Patient notified that we do not have an opening within the timeframe that she needs to be seen and will follow-up with the provider for further advisement.  Dr. Sedalia Muta, When and where can we schedule this patient?
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
## 2022-10-19 ENCOUNTER — Ambulatory Visit: Payer: Managed Care, Other (non HMO) | Admitting: Family Medicine

## 2022-10-19 ENCOUNTER — Encounter: Payer: Self-pay | Admitting: Family Medicine

## 2022-10-19 VITALS — BP 116/74 | HR 82 | Temp 97.2°F | Ht 64.0 in | Wt 210.0 lb

## 2022-10-19 DIAGNOSIS — R0789 Other chest pain: Secondary | ICD-10-CM

## 2022-10-19 MED ORDER — TRAZODONE HCL 50 MG PO TABS: 50.0000 mg | ORAL_TABLET | Freq: Every day | ORAL | 0 refills | Status: DC

## 2022-10-19 NOTE — Assessment & Plan Note (Signed)
Concerning for esophageal spasm.  Start on trazodone 50 mg once before bed

## 2022-10-20 ENCOUNTER — Other Ambulatory Visit: Payer: Self-pay | Admitting: Family Medicine

## 2022-10-20 DIAGNOSIS — G5603 Carpal tunnel syndrome, bilateral upper limbs: Secondary | ICD-10-CM

## 2022-10-21 ENCOUNTER — Other Ambulatory Visit: Payer: Self-pay | Admitting: Family Medicine

## 2022-10-24 ENCOUNTER — Telehealth: Payer: Self-pay

## 2022-10-24 ENCOUNTER — Other Ambulatory Visit: Payer: Self-pay | Admitting: Family Medicine

## 2022-10-24 MED ORDER — ISOSORBIDE MONONITRATE ER 30 MG PO TB24
30.0000 mg | ORAL_TABLET | Freq: Every day | ORAL | 0 refills | Status: DC
Start: 2022-10-24 — End: 2022-11-16

## 2022-10-24 NOTE — Telephone Encounter (Signed)
Patient left message stating she is still having chest pain off and on a couple of times a day.

## 2022-10-24 NOTE — Telephone Encounter (Signed)
Imdur 30 mg daily.  Discussed side effects with patient myself.  Dr. Sedalia Muta

## 2022-10-26 NOTE — Telephone Encounter (Signed)
Patient informed. 

## 2022-10-30 ENCOUNTER — Other Ambulatory Visit: Payer: Self-pay | Admitting: Family Medicine

## 2022-10-30 DIAGNOSIS — Z0001 Encounter for general adult medical examination with abnormal findings: Secondary | ICD-10-CM

## 2022-11-02 ENCOUNTER — Other Ambulatory Visit: Payer: Self-pay | Admitting: Family Medicine

## 2022-11-05 ENCOUNTER — Other Ambulatory Visit: Payer: Self-pay | Admitting: Family Medicine

## 2022-11-08 ENCOUNTER — Other Ambulatory Visit: Payer: Managed Care, Other (non HMO)

## 2022-11-10 ENCOUNTER — Ambulatory Visit: Payer: Managed Care, Other (non HMO) | Admitting: Family Medicine

## 2022-11-16 ENCOUNTER — Other Ambulatory Visit: Payer: Self-pay | Admitting: Family Medicine

## 2022-11-29 ENCOUNTER — Other Ambulatory Visit: Payer: Self-pay | Admitting: Family Medicine

## 2022-11-29 DIAGNOSIS — R41840 Attention and concentration deficit: Secondary | ICD-10-CM

## 2022-11-29 MED ORDER — AMPHETAMINE-DEXTROAMPHETAMINE 10 MG PO TABS: 10.0000 mg | ORAL_TABLET | Freq: Two times a day (BID) | ORAL | 0 refills | Status: DC

## 2022-12-05 ENCOUNTER — Other Ambulatory Visit: Payer: Self-pay | Admitting: Family Medicine

## 2022-12-05 DIAGNOSIS — M545 Low back pain, unspecified: Secondary | ICD-10-CM

## 2022-12-19 ENCOUNTER — Other Ambulatory Visit: Payer: Self-pay | Admitting: Family Medicine

## 2023-01-03 ENCOUNTER — Other Ambulatory Visit: Payer: Self-pay | Admitting: Family Medicine

## 2023-01-08 ENCOUNTER — Other Ambulatory Visit: Payer: Self-pay | Admitting: Family Medicine

## 2023-01-10 ENCOUNTER — Other Ambulatory Visit: Payer: Self-pay | Admitting: Family Medicine

## 2023-01-11 ENCOUNTER — Other Ambulatory Visit: Payer: Managed Care, Other (non HMO)

## 2023-01-11 DIAGNOSIS — R7301 Impaired fasting glucose: Secondary | ICD-10-CM

## 2023-01-11 DIAGNOSIS — Z1159 Encounter for screening for other viral diseases: Secondary | ICD-10-CM

## 2023-01-11 DIAGNOSIS — Z114 Encounter for screening for human immunodeficiency virus [HIV]: Secondary | ICD-10-CM

## 2023-01-11 DIAGNOSIS — E782 Mixed hyperlipidemia: Secondary | ICD-10-CM

## 2023-01-11 DIAGNOSIS — M2559 Pain in other specified joint: Secondary | ICD-10-CM

## 2023-01-12 LAB — ANA W/REFLEX: Anti Nuclear Antibody (ANA): NEGATIVE

## 2023-01-13 LAB — C-REACTIVE PROTEIN: CRP: 6 mg/L (ref 0–10)

## 2023-01-13 LAB — COMPREHENSIVE METABOLIC PANEL
ALT: 18 IU/L (ref 0–32)
AST: 16 IU/L (ref 0–40)
Albumin: 4.4 g/dL (ref 3.8–4.9)
Alkaline Phosphatase: 149 IU/L — ABNORMAL HIGH (ref 44–121)
BUN/Creatinine Ratio: 16 (ref 9–23)
BUN: 15 mg/dL (ref 6–24)
Bilirubin Total: 0.2 mg/dL (ref 0.0–1.2)
CO2: 25 mmol/L (ref 20–29)
Calcium: 9.4 mg/dL (ref 8.7–10.2)
Chloride: 101 mmol/L (ref 96–106)
Creatinine, Ser: 0.92 mg/dL (ref 0.57–1.00)
Globulin, Total: 2.6 g/dL (ref 1.5–4.5)
Glucose: 132 mg/dL — ABNORMAL HIGH (ref 70–99)
Potassium: 4.8 mmol/L (ref 3.5–5.2)
Sodium: 143 mmol/L (ref 134–144)
Total Protein: 7 g/dL (ref 6.0–8.5)
eGFR: 75 mL/min/{1.73_m2} (ref >59)

## 2023-01-13 LAB — CBC WITH DIFFERENTIAL/PLATELET
Basophils Absolute: 0.1 10*3/uL (ref 0.0–0.2)
Basos: 1 %
EOS (ABSOLUTE): 0.3 10*3/uL (ref 0.0–0.4)
Eos: 4 %
Hematocrit: 42.9 % (ref 34.0–46.6)
Hemoglobin: 14 g/dL (ref 11.1–15.9)
Immature Grans (Abs): 0 10*3/uL (ref 0.0–0.1)
Immature Granulocytes: 0 %
Lymphocytes Absolute: 1.8 10*3/uL (ref 0.7–3.1)
Lymphs: 26 %
MCH: 28.3 pg (ref 26.6–33.0)
MCHC: 32.6 g/dL (ref 31.5–35.7)
MCV: 87 fL (ref 79–97)
Monocytes Absolute: 0.4 10*3/uL (ref 0.1–0.9)
Monocytes: 6 %
Neutrophils Absolute: 4.3 10*3/uL (ref 1.4–7.0)
Neutrophils: 63 %
Platelets: 311 10*3/uL (ref 150–450)
RBC: 4.94 x10E6/uL (ref 3.77–5.28)
RDW: 14 % (ref 11.7–15.4)
WBC: 6.8 10*3/uL (ref 3.4–10.8)

## 2023-01-13 LAB — HIV ANTIBODY (ROUTINE TESTING W REFLEX): HIV Screen 4th Generation wRfx: NONREACTIVE

## 2023-01-13 LAB — LIPID PANEL
Chol/HDL Ratio: 2.9 ratio (ref 0.0–4.4)
Cholesterol, Total: 172 mg/dL (ref 100–199)
HDL: 59 mg/dL (ref >39)
LDL Chol Calc (NIH): 76 mg/dL (ref 0–99)
Triglycerides: 226 mg/dL — ABNORMAL HIGH (ref 0–149)
VLDL Cholesterol Cal: 37 mg/dL (ref 5–40)

## 2023-01-13 LAB — HEMOGLOBIN A1C
Est. average glucose Bld gHb Est-mCnc: 134 mg/dL
Hgb A1c MFr Bld: 6.3 % — ABNORMAL HIGH (ref 4.8–5.6)

## 2023-01-13 LAB — RHEUMATOID FACTOR: Rheumatoid fact SerPl-aCnc: 10 IU/mL (ref <14.0)

## 2023-01-13 LAB — CYCLIC CITRUL PEPTIDE ANTIBODY, IGG/IGA: Cyclic Citrullin Peptide Ab: 4 units (ref 0–19)

## 2023-01-13 LAB — HCV INTERPRETATION

## 2023-01-13 LAB — SEDIMENTATION RATE: Sed Rate: 18 mm/hr (ref 0–40)

## 2023-01-13 LAB — HCV AB W REFLEX TO QUANT PCR: HCV Ab: NONREACTIVE

## 2023-01-14 ENCOUNTER — Other Ambulatory Visit: Payer: Self-pay | Admitting: Family Medicine

## 2023-01-15 ENCOUNTER — Encounter: Payer: Self-pay | Admitting: Family Medicine
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
## 2023-01-16 NOTE — Assessment & Plan Note (Signed)
The current medical regimen is effective;  continue present plan and medications.  Continue Cymbalta 60 mg daily bedtime, Klonopin 0.5 mg twice daily as needed 

## 2023-01-16 NOTE — Assessment & Plan Note (Signed)
Well controlled.  No changes to medicines. Continue Crestor 5 mg daily, vascepa 1 gm twice daily. Continue to work on eating a healthy diet and exercise.  Labs drawn today.

## 2023-01-16 NOTE — Assessment & Plan Note (Signed)
Recommend continue to work on eating healthy diet and exercise.  

## 2023-01-16 NOTE — Assessment & Plan Note (Signed)
The current medical regimen is effective;  continue present plan and medications. Adderal 10 mg twice daily  

## 2023-01-17 ENCOUNTER — Ambulatory Visit: Payer: Managed Care, Other (non HMO) | Admitting: Family Medicine

## 2023-01-17 ENCOUNTER — Other Ambulatory Visit (HOSPITAL_COMMUNITY): Payer: Self-pay

## 2023-01-17 ENCOUNTER — Encounter: Payer: Self-pay | Admitting: Family Medicine

## 2023-01-17 VITALS — BP 118/86 | HR 94 | Temp 97.6°F | Ht 64.0 in | Wt 211.0 lb

## 2023-01-17 DIAGNOSIS — G43109 Migraine with aura, not intractable, without status migrainosus: Secondary | ICD-10-CM

## 2023-01-17 DIAGNOSIS — R7301 Impaired fasting glucose: Secondary | ICD-10-CM | POA: Diagnosis not present

## 2023-01-17 DIAGNOSIS — E66812 Obesity, class 2: Secondary | ICD-10-CM

## 2023-01-17 DIAGNOSIS — F411 Generalized anxiety disorder: Secondary | ICD-10-CM

## 2023-01-17 DIAGNOSIS — E782 Mixed hyperlipidemia: Secondary | ICD-10-CM | POA: Diagnosis not present

## 2023-01-17 DIAGNOSIS — R41840 Attention and concentration deficit: Secondary | ICD-10-CM

## 2023-01-17 DIAGNOSIS — Z6836 Body mass index (BMI) 36.0-36.9, adult: Secondary | ICD-10-CM

## 2023-01-17 DIAGNOSIS — M797 Fibromyalgia: Secondary | ICD-10-CM

## 2023-01-17 MED ORDER — ZEPBOUND 2.5 MG/0.5ML ~~LOC~~ SOAJ
2.5000 mg | SUBCUTANEOUS | 0 refills | Status: DC
Start: 2023-01-17 — End: 2023-05-18
  Filled 2023-01-17 – 2023-03-22 (×4): qty 2, 28d supply, fill #0

## 2023-01-17 MED ORDER — ZEPBOUND 5 MG/0.5ML ~~LOC~~ SOAJ
5.0000 mg | SUBCUTANEOUS | 0 refills | Status: DC
Start: 2023-02-17 — End: 2023-05-18
  Filled 2023-01-17 (×2): qty 2, 28d supply, fill #0

## 2023-01-17 MED ORDER — FENOFIBRATE 120 MG PO TABS
120.0000 mg | ORAL_TABLET | Freq: Every day | ORAL | 1 refills | Status: DC
Start: 2023-01-17 — End: 2023-07-14

## 2023-01-17 MED ORDER — ZEPBOUND 7.5 MG/0.5ML ~~LOC~~ SOAJ
7.5000 mg | SUBCUTANEOUS | 0 refills | Status: DC
Start: 2023-03-20 — End: 2023-05-18
  Filled 2023-01-17: qty 2, 28d supply, fill #0

## 2023-01-17 NOTE — Patient Instructions (Signed)
Start the fenofibrate 120 mg daily Zepbound 2.5 mg weekly sent to [facility]

## 2023-01-18 ENCOUNTER — Other Ambulatory Visit: Payer: Self-pay | Admitting: Family Medicine

## 2023-01-18 ENCOUNTER — Telehealth: Payer: Self-pay

## 2023-01-18 NOTE — Assessment & Plan Note (Signed)
The current medical regimen is effective;  continue present plan and medications. Continue  cymbalta, amitriptyline, nabumetone 750 mg one twice daily. robaxin 750 mg one 2 every 6 hours as needed... only takes approximately once daily.

## 2023-01-18 NOTE — Assessment & Plan Note (Signed)
MAXALT WORKING AS NEEDED. NO OTHER TX NEEDED.

## 2023-01-18 NOTE — Telephone Encounter (Signed)
PA submitted and approved via covermymeds for fenofibrate 120 mg. [id number]

## 2023-01-30 ENCOUNTER — Other Ambulatory Visit: Payer: Self-pay | Admitting: Family Medicine

## 2023-01-30 DIAGNOSIS — G5603 Carpal tunnel syndrome, bilateral upper limbs: Secondary | ICD-10-CM

## 2023-02-02 ENCOUNTER — Other Ambulatory Visit: Payer: Self-pay

## 2023-02-02 ENCOUNTER — Telehealth: Payer: Self-pay

## 2023-02-02 ENCOUNTER — Other Ambulatory Visit (HOSPITAL_COMMUNITY): Payer: Self-pay

## 2023-02-02 ENCOUNTER — Other Ambulatory Visit: Payer: Self-pay | Admitting: Family Medicine

## 2023-02-02 DIAGNOSIS — M545 Low back pain, unspecified: Secondary | ICD-10-CM

## 2023-02-02 NOTE — Telephone Encounter (Signed)
Patient called stating that she had was nauseated/body aching/ feeling bad in general from the zepbound injection that she did on Sunday. She states that she remembers having a little bit of reaction like this and it was tolerated but she states it was x4 worse with the symptom reactions. I asked the patient what MG of the zepbound that the pharmacy gave her because I seen where there were several different MG's sent to the pharmacy, and she looked and stated that they gave her the 5 mg to be started on. So patient's question is that since the achiness has not went away and she believes that it has flared up her Fibromyalgia which she is supposed to take the robaxin for. She states she has been taking it three times daily and she is still not having relief and she is wanting to know what she needs to be to help it please advise.

## 2023-02-03 ENCOUNTER — Ambulatory Visit: Payer: Managed Care, Other (non HMO) | Admitting: Family Medicine

## 2023-02-03 NOTE — Telephone Encounter (Signed)
Patient informed, and patient states that she has been doing the 3 pills 4 times daily of the robaxin and its still not touching it she states. She said if it gets worse enough she will head to the Hospital she stated. I told her to make sure she pushes her fluids and continues to try to flush the medication out. She understood verbally.

## 2023-02-03 NOTE — Telephone Encounter (Signed)
Called patient and made the appointment. She was agreed to be check for covid and flu.

## 2023-02-06 ENCOUNTER — Other Ambulatory Visit: Payer: Self-pay | Admitting: Family Medicine

## 2023-02-09 ENCOUNTER — Encounter (HOSPITAL_COMMUNITY): Payer: Self-pay

## 2023-02-09 ENCOUNTER — Other Ambulatory Visit: Payer: Self-pay

## 2023-02-09 ENCOUNTER — Other Ambulatory Visit (HOSPITAL_COMMUNITY): Payer: Self-pay

## 2023-03-08 ENCOUNTER — Other Ambulatory Visit: Payer: Self-pay | Admitting: Family Medicine

## 2023-03-13 ENCOUNTER — Other Ambulatory Visit: Payer: Self-pay | Admitting: Family Medicine

## 2023-03-17 ENCOUNTER — Other Ambulatory Visit: Payer: Self-pay | Admitting: Physician Assistant

## 2023-03-22 ENCOUNTER — Other Ambulatory Visit (HOSPITAL_COMMUNITY): Payer: Self-pay

## 2023-04-05 ENCOUNTER — Other Ambulatory Visit: Payer: Self-pay | Admitting: Family Medicine

## 2023-04-05 DIAGNOSIS — Z0001 Encounter for general adult medical examination with abnormal findings: Secondary | ICD-10-CM

## 2023-04-18 ENCOUNTER — Other Ambulatory Visit: Payer: Self-pay | Admitting: Family Medicine

## 2023-04-19 ENCOUNTER — Other Ambulatory Visit: Payer: Self-pay | Admitting: Family Medicine

## 2023-04-21 ENCOUNTER — Other Ambulatory Visit: Payer: Managed Care, Other (non HMO)

## 2023-04-21 DIAGNOSIS — R7301 Impaired fasting glucose: Secondary | ICD-10-CM

## 2023-04-21 DIAGNOSIS — E782 Mixed hyperlipidemia: Secondary | ICD-10-CM

## 2023-04-25 NOTE — Progress Notes (Signed)
Cancelled. Dr. Aerilyn Slee  

## 2023-04-26 ENCOUNTER — Encounter (INDEPENDENT_AMBULATORY_CARE_PROVIDER_SITE_OTHER): Payer: Managed Care, Other (non HMO) | Admitting: Family Medicine

## 2023-04-26 DIAGNOSIS — E782 Mixed hyperlipidemia: Secondary | ICD-10-CM

## 2023-05-04 ENCOUNTER — Other Ambulatory Visit: Payer: Self-pay | Admitting: Family Medicine

## 2023-05-04 DIAGNOSIS — R41840 Attention and concentration deficit: Secondary | ICD-10-CM

## 2023-05-05 ENCOUNTER — Other Ambulatory Visit: Payer: Self-pay | Admitting: Family Medicine

## 2023-05-05 DIAGNOSIS — G5603 Carpal tunnel syndrome, bilateral upper limbs: Secondary | ICD-10-CM

## 2023-05-05 MED ORDER — AMPHETAMINE-DEXTROAMPHETAMINE 10 MG PO TABS
10.0000 mg | ORAL_TABLET | Freq: Two times a day (BID) | ORAL | 0 refills | Status: DC
Start: 2023-05-05 — End: 2023-09-14

## 2023-05-11 ENCOUNTER — Other Ambulatory Visit: Payer: Self-pay

## 2023-05-18 ENCOUNTER — Ambulatory Visit: Payer: Managed Care, Other (non HMO) | Admitting: Family Medicine

## 2023-05-18 ENCOUNTER — Encounter: Payer: Self-pay | Admitting: Internal Medicine

## 2023-05-18 ENCOUNTER — Encounter: Payer: Self-pay | Admitting: Family Medicine

## 2023-05-18 VITALS — BP 134/76 | HR 84 | Temp 97.8°F | Ht 64.0 in | Wt 216.0 lb

## 2023-05-18 DIAGNOSIS — J189 Pneumonia, unspecified organism: Secondary | ICD-10-CM | POA: Diagnosis not present

## 2023-05-18 MED ORDER — FLUCONAZOLE 150 MG PO TABS: 150.0000 mg | ORAL_TABLET | Freq: Every day | ORAL | 0 refills | Status: DC

## 2023-05-18 MED ORDER — AMOXICILLIN-POT CLAVULANATE 875-125 MG PO TABS: 1.0000 | ORAL_TABLET | Freq: Two times a day (BID) | ORAL | 0 refills | Status: DC
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
## 2023-05-18 NOTE — Assessment & Plan Note (Signed)
Presentation concerning for pneumonia.  Treat with additional antibiotic: given augmentin and recommend complete doxycycline.  Recommend add flonase for congestion. Patient has at home.  Continue cough syrup as needed.

## 2023-06-04 ENCOUNTER — Other Ambulatory Visit: Payer: Self-pay | Admitting: Family Medicine

## 2023-06-04 DIAGNOSIS — M545 Low back pain, unspecified: Secondary | ICD-10-CM

## 2023-06-05 ENCOUNTER — Telehealth: Payer: Self-pay

## 2023-06-05 ENCOUNTER — Encounter: Payer: Self-pay | Admitting: Family Medicine

## 2023-06-05 ENCOUNTER — Ambulatory Visit: Payer: Managed Care, Other (non HMO) | Admitting: Family Medicine

## 2023-06-05 VITALS — BP 110/64 | HR 97 | Temp 97.3°F | Resp 14 | Ht 64.0 in | Wt 214.0 lb

## 2023-06-05 DIAGNOSIS — E559 Vitamin D deficiency, unspecified: Secondary | ICD-10-CM | POA: Diagnosis not present

## 2023-06-05 DIAGNOSIS — R0782 Intercostal pain: Secondary | ICD-10-CM | POA: Insufficient documentation

## 2023-06-05 DIAGNOSIS — E039 Hypothyroidism, unspecified: Secondary | ICD-10-CM | POA: Insufficient documentation

## 2023-06-05 DIAGNOSIS — M940 Chondrocostal junction syndrome [Tietze]: Secondary | ICD-10-CM | POA: Insufficient documentation

## 2023-06-05 MED ORDER — KETOROLAC TROMETHAMINE 60 MG/2ML IM SOLN
60.0000 mg | Freq: Once | INTRAMUSCULAR | Status: AC
  Administered 2023-06-05: 60 mg via INTRAMUSCULAR

## 2023-06-05 NOTE — Telephone Encounter (Signed)
Patient called and wanted to be see today for chronic chest pain, stated that she has been see in the hospital plenty of times for this and wanted to her PCP to see her the next time it happen, while it was happening.   Made patient aware, Dr. Sedalia Muta does not have any open appointments but schedule her with Norwood Levo.
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
## 2023-06-06 ENCOUNTER — Encounter: Payer: Self-pay | Admitting: Family Medicine

## 2023-06-06 DIAGNOSIS — E559 Vitamin D deficiency, unspecified: Secondary | ICD-10-CM | POA: Insufficient documentation

## 2023-06-06 LAB — CBC WITH DIFFERENTIAL/PLATELET
Basophils Absolute: 0.1 10*3/uL (ref 0.0–0.2)
Basos: 1 %
EOS (ABSOLUTE): 0.3 10*3/uL (ref 0.0–0.4)
Eos: 4 %
Hematocrit: 41.4 % (ref 34.0–46.6)
Hemoglobin: 13 g/dL (ref 11.1–15.9)
Immature Grans (Abs): 0 10*3/uL (ref 0.0–0.1)
Immature Granulocytes: 0 %
Lymphocytes Absolute: 1.8 10*3/uL (ref 0.7–3.1)
Lymphs: 22 %
MCH: 28 pg (ref 26.6–33.0)
MCHC: 31.4 g/dL — ABNORMAL LOW (ref 31.5–35.7)
MCV: 89 fL (ref 79–97)
Monocytes Absolute: 0.6 10*3/uL (ref 0.1–0.9)
Monocytes: 7 %
Neutrophils Absolute: 5.2 10*3/uL (ref 1.4–7.0)
Neutrophils: 66 %
Platelets: 367 10*3/uL (ref 150–450)
RBC: 4.65 x10E6/uL (ref 3.77–5.28)
RDW: 13.8 % (ref 11.7–15.4)
WBC: 8 10*3/uL (ref 3.4–10.8)

## 2023-06-06 LAB — COMPREHENSIVE METABOLIC PANEL
ALT: 26 [IU]/L (ref 0–32)
AST: 20 [IU]/L (ref 0–40)
Albumin: 4.4 g/dL (ref 3.8–4.9)
Alkaline Phosphatase: 91 [IU]/L (ref 44–121)
BUN/Creatinine Ratio: 16 (ref 9–23)
BUN: 14 mg/dL (ref 6–24)
Bilirubin Total: <0.2 mg/dL (ref 0.0–1.2)
CO2: 24 mmol/L (ref 20–29)
Calcium: 9.6 mg/dL (ref 8.7–10.2)
Chloride: 103 mmol/L (ref 96–106)
Creatinine, Ser: 0.85 mg/dL (ref 0.57–1.00)
Globulin, Total: 2.6 g/dL (ref 1.5–4.5)
Glucose: 91 mg/dL (ref 70–99)
Potassium: 4.4 mmol/L (ref 3.5–5.2)
Sodium: 142 mmol/L (ref 134–144)
Total Protein: 7 g/dL (ref 6.0–8.5)
eGFR: 82 mL/min/{1.73_m2} (ref >59)

## 2023-06-06 LAB — TSH: TSH: 3.23 u[IU]/mL (ref 0.450–4.500)

## 2023-06-06 LAB — VITAMIN D 25 HYDROXY (VIT D DEFICIENCY, FRACTURES): Vit D, 25-Hydroxy: 14.2 ng/mL — ABNORMAL LOW (ref 30.0–100.0)

## 2023-06-06 MED ORDER — VITAMIN D (ERGOCALCIFEROL) 1.25 MG (50000 UNIT) PO CAPS: 50000.0000 [IU] | ORAL_CAPSULE | ORAL | 0 refills | Status: AC

## 2023-06-06 NOTE — Assessment & Plan Note (Addendum)
Recurrent episodes of chest pain, similar to previous episodes that were not associated with cardiac pathology. Recent history of pneumonia, but current pain is different from pneumonia-related pain. Pain not relieved by Methocarbamol, Ambien, Klonopin, heating pad, or TENS unit. -Obtain EKG in office today - NSR ventricular rate 82, PR - 178, QRS - 92, QTc - 472, no ST elevation or depression -Order chest x-ray and potentially CT scan. - awaiting testing for further recommendations -Draw blood for lab work today. - Vitamin D level 14.2 - order Vitamin D 50,000 units once a week for 12 weeks -Toradol 60 mg IM given in the office -Continue nabumetone (RELAFEN) 750 MG tablet twice a day for pain.

## 2023-06-06 NOTE — Assessment & Plan Note (Signed)
TSH last year 3.000 - Continue taking levothyroxine 50 mcg once daily - labs drawn TSH 3.230 - therapeutic

## 2023-06-13 ENCOUNTER — Other Ambulatory Visit: Payer: Self-pay

## 2023-06-15 ENCOUNTER — Other Ambulatory Visit: Payer: Self-pay | Admitting: Family Medicine

## 2023-06-23 ENCOUNTER — Encounter: Payer: Self-pay | Admitting: Family Medicine

## 2023-06-26 ENCOUNTER — Other Ambulatory Visit: Payer: Self-pay | Admitting: Family Medicine

## 2023-06-26 MED ORDER — ORPHENADRINE CITRATE ER 100 MG PO TB12: 100.0000 mg | ORAL_TABLET | Freq: Two times a day (BID) | ORAL | 2 refills | Status: DC
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
## 2023-06-27 ENCOUNTER — Ambulatory Visit: Payer: Managed Care, Other (non HMO) | Admitting: Family Medicine

## 2023-06-27 VITALS — BP 136/84 | HR 95 | Temp 97.8°F | Ht 64.0 in | Wt 213.0 lb

## 2023-06-27 DIAGNOSIS — Z1231 Encounter for screening mammogram for malignant neoplasm of breast: Secondary | ICD-10-CM

## 2023-06-27 DIAGNOSIS — E039 Hypothyroidism, unspecified: Secondary | ICD-10-CM | POA: Diagnosis not present

## 2023-06-27 DIAGNOSIS — E66812 Obesity, class 2: Secondary | ICD-10-CM

## 2023-06-27 DIAGNOSIS — Z23 Encounter for immunization: Secondary | ICD-10-CM

## 2023-06-27 DIAGNOSIS — E782 Mixed hyperlipidemia: Secondary | ICD-10-CM

## 2023-06-27 DIAGNOSIS — Z860100 Personal history of colon polyps, unspecified: Secondary | ICD-10-CM

## 2023-06-27 DIAGNOSIS — Z6836 Body mass index (BMI) 36.0-36.9, adult: Secondary | ICD-10-CM

## 2023-06-27 DIAGNOSIS — R7301 Impaired fasting glucose: Secondary | ICD-10-CM

## 2023-06-27 DIAGNOSIS — R5383 Other fatigue: Secondary | ICD-10-CM

## 2023-06-28 LAB — COMPREHENSIVE METABOLIC PANEL
ALT: 30 [IU]/L (ref 0–32)
AST: 27 [IU]/L (ref 0–40)
Albumin: 4.6 g/dL (ref 3.8–4.9)
Alkaline Phosphatase: 76 [IU]/L (ref 44–121)
BUN/Creatinine Ratio: 17 (ref 9–23)
BUN: 16 mg/dL (ref 6–24)
Bilirubin Total: <0.2 mg/dL (ref 0.0–1.2)
CO2: 24 mmol/L (ref 20–29)
Calcium: 9.6 mg/dL (ref 8.7–10.2)
Chloride: 103 mmol/L (ref 96–106)
Creatinine, Ser: 0.92 mg/dL (ref 0.57–1.00)
Globulin, Total: 2.3 g/dL (ref 1.5–4.5)
Glucose: 96 mg/dL (ref 70–99)
Potassium: 4.5 mmol/L (ref 3.5–5.2)
Sodium: 143 mmol/L (ref 134–144)
Total Protein: 6.9 g/dL (ref 6.0–8.5)
eGFR: 75 mL/min/{1.73_m2} (ref >59)

## 2023-06-28 LAB — LIPID PANEL
Chol/HDL Ratio: 2.8 {ratio} (ref 0.0–4.4)
Cholesterol, Total: 173 mg/dL (ref 100–199)
HDL: 61 mg/dL (ref >39)
LDL Chol Calc (NIH): 82 mg/dL (ref 0–99)
Triglycerides: 178 mg/dL — ABNORMAL HIGH (ref 0–149)
VLDL Cholesterol Cal: 30 mg/dL (ref 5–40)

## 2023-06-28 LAB — CBC WITH DIFFERENTIAL/PLATELET
Basophils Absolute: 0.1 10*3/uL (ref 0.0–0.2)
Basos: 1 %
EOS (ABSOLUTE): 0.3 10*3/uL (ref 0.0–0.4)
Eos: 3 %
Hematocrit: 39.8 % (ref 34.0–46.6)
Hemoglobin: 12.8 g/dL (ref 11.1–15.9)
Immature Grans (Abs): 0 10*3/uL (ref 0.0–0.1)
Immature Granulocytes: 1 %
Lymphocytes Absolute: 1.7 10*3/uL (ref 0.7–3.1)
Lymphs: 21 %
MCH: 28.1 pg (ref 26.6–33.0)
MCHC: 32.2 g/dL (ref 31.5–35.7)
MCV: 88 fL (ref 79–97)
Monocytes Absolute: 0.7 10*3/uL (ref 0.1–0.9)
Monocytes: 9 %
Neutrophils Absolute: 5.2 10*3/uL (ref 1.4–7.0)
Neutrophils: 65 %
Platelets: 389 10*3/uL (ref 150–450)
RBC: 4.55 x10E6/uL (ref 3.77–5.28)
RDW: 13.5 % (ref 11.7–15.4)
WBC: 8 10*3/uL (ref 3.4–10.8)

## 2023-07-02 DIAGNOSIS — Z860100 Personal history of colon polyps, unspecified: Secondary | ICD-10-CM | POA: Insufficient documentation

## 2023-07-02 DIAGNOSIS — Z1231 Encounter for screening mammogram for malignant neoplasm of breast: Secondary | ICD-10-CM | POA: Insufficient documentation

## 2023-07-02 DIAGNOSIS — R5383 Other fatigue: Secondary | ICD-10-CM | POA: Insufficient documentation

## 2023-07-02 NOTE — Assessment & Plan Note (Signed)
Recommend continue to work on eating healthy diet and exercise.  

## 2023-07-02 NOTE — Assessment & Plan Note (Signed)
Check labs 

## 2023-07-02 NOTE — Assessment & Plan Note (Signed)
Check A1C.  Recommend continue to work on eating healthy diet and exercise.

## 2023-07-02 NOTE — Assessment & Plan Note (Signed)
Previously well controlled Continue Synthroid at current dose  

## 2023-07-02 NOTE — Assessment & Plan Note (Signed)
Well controlled.  No changes to medicines. Continue Crestor 10 mg daily, vascepa 1 gm twice daily, Fenofibrate 120 mg daily. Continue to work on eating a healthy diet and exercise.  Labs drawn today.

## 2023-07-03 LAB — B12 AND FOLATE PANEL
Folate: 15.1 ng/mL (ref >3.0)
Vitamin B-12: 339 pg/mL (ref 232–1245)

## 2023-07-03 LAB — METHYLMALONIC ACID, SERUM: Methylmalonic Acid: 219 nmol/L (ref 0–378)

## 2023-07-04 ENCOUNTER — Other Ambulatory Visit: Payer: Self-pay | Admitting: Family Medicine

## 2023-07-14 ENCOUNTER — Other Ambulatory Visit: Payer: Self-pay | Admitting: Family Medicine

## 2023-07-14 ENCOUNTER — Other Ambulatory Visit: Payer: Self-pay | Admitting: Physician Assistant

## 2023-07-14 DIAGNOSIS — E782 Mixed hyperlipidemia: Secondary | ICD-10-CM

## 2023-07-15 ENCOUNTER — Other Ambulatory Visit: Payer: Self-pay | Admitting: Family Medicine

## 2023-07-19 ENCOUNTER — Other Ambulatory Visit: Payer: Self-pay | Admitting: Family Medicine

## 2023-07-20 ENCOUNTER — Telehealth: Payer: Self-pay

## 2023-07-20 NOTE — Telephone Encounter (Signed)
 PA submitted and approved via covermymeds for generic vascepa.

## 2023-07-29 ENCOUNTER — Other Ambulatory Visit: Payer: Self-pay | Admitting: Family Medicine

## 2023-07-29 DIAGNOSIS — E559 Vitamin D deficiency, unspecified: Secondary | ICD-10-CM

## 2023-08-01 ENCOUNTER — Telehealth: Payer: Self-pay

## 2023-08-01 NOTE — Telephone Encounter (Signed)
Called patient, due to provider being out the week of need to reschedule appointment. Left message for patient to call office back.

## 2023-08-23 ENCOUNTER — Ambulatory Visit: Payer: Managed Care, Other (non HMO) | Admitting: Family Medicine

## 2023-08-23 ENCOUNTER — Encounter: Payer: Self-pay | Admitting: Family Medicine

## 2023-08-23 VITALS — BP 118/72 | HR 84 | Temp 97.2°F | Ht 64.0 in | Wt 216.0 lb

## 2023-08-23 DIAGNOSIS — H66001 Acute suppurative otitis media without spontaneous rupture of ear drum, right ear: Secondary | ICD-10-CM

## 2023-08-23 MED ORDER — AMOXICILLIN 875 MG PO TABS
875.0000 mg | ORAL_TABLET | Freq: Two times a day (BID) | ORAL | 0 refills | Status: AC
Start: 2023-08-23 — End: 2023-09-02

## 2023-08-23 MED ORDER — FLUCONAZOLE 150 MG PO TABS
150.0000 mg | ORAL_TABLET | Freq: Every day | ORAL | 0 refills | Status: AC
Start: 2023-08-23 — End: 2023-08-24
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
## 2023-08-23 NOTE — Assessment & Plan Note (Signed)
Acute Amoxicillin 875 mg PO TWICE A DAY for 10 days Diflucan 150 mg by mouth once as needed for itching Educated on possible side effects of medication and that she may try a probiotic for GI symptoms

## 2023-09-04 ENCOUNTER — Other Ambulatory Visit: Payer: Self-pay | Admitting: Family Medicine

## 2023-09-06 ENCOUNTER — Other Ambulatory Visit: Payer: Self-pay | Admitting: Family Medicine

## 2023-09-13 ENCOUNTER — Other Ambulatory Visit: Payer: Self-pay

## 2023-09-14 ENCOUNTER — Other Ambulatory Visit: Payer: Self-pay | Admitting: Family Medicine

## 2023-09-14 DIAGNOSIS — R41840 Attention and concentration deficit: Secondary | ICD-10-CM

## 2023-09-14 DIAGNOSIS — Z0001 Encounter for general adult medical examination with abnormal findings: Secondary | ICD-10-CM

## 2023-09-14 MED ORDER — AMPHETAMINE-DEXTROAMPHETAMINE 10 MG PO TABS: 10.0000 mg | ORAL_TABLET | Freq: Two times a day (BID) | ORAL | 0 refills | Status: DC
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
## 2023-09-20 ENCOUNTER — Ambulatory Visit: Admitting: Family Medicine

## 2023-09-20 ENCOUNTER — Encounter: Payer: Self-pay | Admitting: Family Medicine

## 2023-09-20 VITALS — BP 130/84 | HR 97 | Temp 97.6°F | Ht 64.0 in | Wt 216.0 lb

## 2023-09-20 DIAGNOSIS — A630 Anogenital (venereal) warts: Secondary | ICD-10-CM

## 2023-09-20 DIAGNOSIS — Z1211 Encounter for screening for malignant neoplasm of colon: Secondary | ICD-10-CM

## 2023-09-20 DIAGNOSIS — Z1231 Encounter for screening mammogram for malignant neoplasm of breast: Secondary | ICD-10-CM | POA: Diagnosis not present

## 2023-09-24 DIAGNOSIS — Z1211 Encounter for screening for malignant neoplasm of colon: Secondary | ICD-10-CM | POA: Insufficient documentation

## 2023-09-24 DIAGNOSIS — Z1231 Encounter for screening mammogram for malignant neoplasm of breast: Secondary | ICD-10-CM | POA: Insufficient documentation

## 2023-09-24 DIAGNOSIS — A630 Anogenital (venereal) warts: Secondary | ICD-10-CM | POA: Insufficient documentation

## 2023-09-24 NOTE — Assessment & Plan Note (Addendum)
Refer to GI for colonoscopy.

## 2023-09-24 NOTE — Assessment & Plan Note (Signed)
 Order mammogram

## 2023-09-24 NOTE — Assessment & Plan Note (Signed)
Cryotherapy completed.

## 2023-09-25 ENCOUNTER — Other Ambulatory Visit: Payer: Self-pay | Admitting: Family Medicine

## 2023-09-25 DIAGNOSIS — R41840 Attention and concentration deficit: Secondary | ICD-10-CM

## 2023-09-25 MED ORDER — LISDEXAMFETAMINE DIMESYLATE 40 MG PO CAPS: 40.0000 mg | ORAL_CAPSULE | ORAL | 0 refills | Status: DC

## 2023-09-25 NOTE — Progress Notes (Unsigned)
vyvans

## 2023-09-27 ENCOUNTER — Other Ambulatory Visit: Payer: Self-pay | Admitting: Family Medicine

## 2023-10-10 ENCOUNTER — Ambulatory Visit: Payer: Managed Care, Other (non HMO) | Admitting: Family Medicine

## 2023-10-15 ENCOUNTER — Other Ambulatory Visit: Payer: Self-pay | Admitting: Family Medicine

## 2023-10-23 ENCOUNTER — Ambulatory Visit: Admitting: Physician Assistant

## 2023-10-23 ENCOUNTER — Encounter: Payer: Self-pay | Admitting: Physician Assistant

## 2023-10-23 VITALS — BP 138/82 | HR 101 | Temp 98.0°F | Ht 64.0 in | Wt 217.8 lb

## 2023-10-23 DIAGNOSIS — M797 Fibromyalgia: Secondary | ICD-10-CM

## 2023-10-23 DIAGNOSIS — G5762 Lesion of plantar nerve, left lower limb: Secondary | ICD-10-CM | POA: Diagnosis not present

## 2023-10-23 MED ORDER — PREDNISONE 20 MG PO TABS
ORAL_TABLET | ORAL | 0 refills | Status: AC
Start: 2023-10-23 — End: 2023-11-01
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
## 2023-10-23 NOTE — Assessment & Plan Note (Signed)
 Fibromyalgia complicates management of other pain conditions. She is aware of challenges in managing nerve pain and is taking nabumetone. - Continue nabumetone for anti-inflammatory benefits. - Advise monitoring for exacerbation of fibromyalgia symptoms.

## 2023-10-23 NOTE — Assessment & Plan Note (Signed)
 Sharp pain in the left foot, particularly affecting the pinky toe, consistent with Morton's neuroma. Pain exacerbated by pressure and movement, likely due to recent activity and footwear. Inflammatory ball along the nerve between toes causing compression and pain. - Prescribed oral prednisone for a 5-day taper to reduce inflammation and swelling. - Advised use of ibuprofen for pain management and anti-inflammatory effects. - Recommended Morton's neuroma patches or inserts to alleviate nerve pressure. - Suggested turmeric as a natural anti-inflammatory supplement. - Advised against heat, recommended cooling and elevating the foot. - Instructed to message via MyChart by Thursday if symptoms persist for potential podiatrist referral. - Sent prescription to [facility].

## 2023-10-23 NOTE — Patient Instructions (Signed)
 VISIT SUMMARY:  You came in today because of new sharp pain in your left foot, which you described as a 'jolting' sensation between your toes. You have a history of fibromyalgia, which you are managing with Nabumetone. You mentioned that the pain gets worse with movement and pressure, and you have been active around the house and yard recently.  YOUR PLAN:  -MORTON'S NEUROMA: Morton's neuroma is a condition where a nerve in your foot gets compressed, causing sharp pain, especially between the toes. To help with this, you have been prescribed a 5-day course of oral prednisone to reduce inflammation and swelling. You should also take ibuprofen for pain relief and its anti-inflammatory effects. Using Morton's neuroma patches or inserts can help alleviate the pressure on the nerve. Additionally, turmeric is suggested as a natural anti-inflammatory supplement. Avoid using heat on your foot; instead, try cooling and elevating it. If your symptoms persist, please message via MyChart by Thursday for a potential referral to a podiatrist. [facility].  -FIBROMYALGIA: Fibromyalgia is a chronic condition that causes widespread pain and can complicate the management of other pain conditions. Continue taking Nabumetone for its anti-inflammatory benefits and monitor for any worsening of your fibromyalgia symptoms.  INSTRUCTIONS:  Please follow up by messaging via MyChart by Thursday if your foot pain persists for a potential referral to a podiatrist.
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
## 2023-10-25 ENCOUNTER — Ambulatory Visit: Admitting: Family Medicine

## 2023-10-25 VITALS — BP 150/90 | HR 93 | Temp 97.8°F | Ht 64.0 in | Wt 220.0 lb

## 2023-10-25 DIAGNOSIS — R41840 Attention and concentration deficit: Secondary | ICD-10-CM | POA: Diagnosis not present

## 2023-10-25 DIAGNOSIS — G5762 Lesion of plantar nerve, left lower limb: Secondary | ICD-10-CM

## 2023-10-25 DIAGNOSIS — A630 Anogenital (venereal) warts: Secondary | ICD-10-CM

## 2023-10-25 MED ORDER — PODOFILOX 0.5 % EX SOLN: CUTANEOUS | 0 refills | Status: DC

## 2023-10-25 MED ORDER — AMPHETAMINE-DEXTROAMPHETAMINE 10 MG PO TABS: 10.0000 mg | ORAL_TABLET | Freq: Two times a day (BID) | ORAL | 0 refills | Status: AC

## 2023-10-28 NOTE — Assessment & Plan Note (Signed)
 Sent podofilox  Referring to GYN

## 2023-10-28 NOTE — Assessment & Plan Note (Signed)
The current medical regimen is effective;  continue present plan and medications. Adderal 10 mg twice daily  

## 2023-10-29 ENCOUNTER — Encounter: Payer: Self-pay | Admitting: Family Medicine

## 2023-11-06 ENCOUNTER — Other Ambulatory Visit: Payer: Self-pay | Admitting: Family Medicine

## 2023-11-06 DIAGNOSIS — Z0001 Encounter for general adult medical examination with abnormal findings: Secondary | ICD-10-CM

## 2023-11-15 ENCOUNTER — Encounter (HOSPITAL_COMMUNITY): Payer: Self-pay

## 2023-11-22 ENCOUNTER — Ambulatory Visit: Admitting: Family Medicine

## 2023-12-11 ENCOUNTER — Other Ambulatory Visit: Payer: Self-pay

## 2023-12-14 ENCOUNTER — Other Ambulatory Visit: Payer: Self-pay | Admitting: Family Medicine

## 2023-12-15 ENCOUNTER — Other Ambulatory Visit: Payer: Self-pay

## 2023-12-26 ENCOUNTER — Ambulatory Visit: Admitting: Family Medicine
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
## 2023-12-27 ENCOUNTER — Encounter: Payer: Self-pay | Admitting: Family Medicine

## 2023-12-27 ENCOUNTER — Ambulatory Visit (INDEPENDENT_AMBULATORY_CARE_PROVIDER_SITE_OTHER): Admitting: Family Medicine

## 2023-12-27 VITALS — BP 128/68 | HR 82 | Temp 98.0°F | Ht 64.0 in | Wt 221.0 lb

## 2023-12-27 DIAGNOSIS — E039 Hypothyroidism, unspecified: Secondary | ICD-10-CM | POA: Diagnosis not present

## 2023-12-27 DIAGNOSIS — R7303 Prediabetes: Secondary | ICD-10-CM

## 2023-12-27 DIAGNOSIS — R41840 Attention and concentration deficit: Secondary | ICD-10-CM | POA: Diagnosis not present

## 2023-12-27 DIAGNOSIS — M797 Fibromyalgia: Secondary | ICD-10-CM | POA: Diagnosis not present

## 2023-12-27 DIAGNOSIS — E782 Mixed hyperlipidemia: Secondary | ICD-10-CM

## 2023-12-27 DIAGNOSIS — M545 Low back pain, unspecified: Secondary | ICD-10-CM

## 2023-12-27 DIAGNOSIS — E559 Vitamin D deficiency, unspecified: Secondary | ICD-10-CM

## 2023-12-27 DIAGNOSIS — Z1231 Encounter for screening mammogram for malignant neoplasm of breast: Secondary | ICD-10-CM

## 2023-12-27 NOTE — Patient Instructions (Signed)
 Med [facility] [address], [place], [place] [address] [phone]  IMAGING 8-5 PM MONDAY THROUGH FRIDAY.

## 2023-12-28 ENCOUNTER — Ambulatory Visit: Payer: Self-pay | Admitting: Family Medicine

## 2023-12-28 DIAGNOSIS — R7303 Prediabetes: Secondary | ICD-10-CM | POA: Insufficient documentation

## 2023-12-28 LAB — CBC WITH DIFFERENTIAL/PLATELET
Basophils Absolute: 0.1 10*3/uL (ref 0.0–0.2)
Basos: 2 %
EOS (ABSOLUTE): 0.3 10*3/uL (ref 0.0–0.4)
Eos: 4 %
Hematocrit: 40.9 % (ref 34.0–46.6)
Hemoglobin: 13 g/dL (ref 11.1–15.9)
Immature Grans (Abs): 0 10*3/uL (ref 0.0–0.1)
Immature Granulocytes: 0 %
Lymphocytes Absolute: 1.8 10*3/uL (ref 0.7–3.1)
Lymphs: 28 %
MCH: 28.7 pg (ref 26.6–33.0)
MCHC: 31.8 g/dL (ref 31.5–35.7)
MCV: 90 fL (ref 79–97)
Monocytes Absolute: 0.5 10*3/uL (ref 0.1–0.9)
Monocytes: 8 %
Neutrophils Absolute: 3.7 10*3/uL (ref 1.4–7.0)
Neutrophils: 58 %
Platelets: 369 10*3/uL (ref 150–450)
RBC: 4.53 x10E6/uL (ref 3.77–5.28)
RDW: 14.3 % (ref 11.7–15.4)
WBC: 6.4 10*3/uL (ref 3.4–10.8)

## 2023-12-28 LAB — COMPREHENSIVE METABOLIC PANEL WITH GFR
ALT: 19 IU/L (ref 0–32)
AST: 18 IU/L (ref 0–40)
Albumin: 4.7 g/dL (ref 3.8–4.9)
Alkaline Phosphatase: 70 IU/L (ref 44–121)
BUN/Creatinine Ratio: 15 (ref 9–23)
BUN: 14 mg/dL (ref 6–24)
Bilirubin Total: <0.2 mg/dL (ref 0.0–1.2)
CO2: 23 mmol/L (ref 20–29)
Calcium: 9.8 mg/dL (ref 8.7–10.2)
Chloride: 104 mmol/L (ref 96–106)
Creatinine, Ser: 0.93 mg/dL (ref 0.57–1.00)
Globulin, Total: 2.4 g/dL (ref 1.5–4.5)
Glucose: 121 mg/dL — ABNORMAL HIGH (ref 70–99)
Potassium: 4.6 mmol/L (ref 3.5–5.2)
Sodium: 142 mmol/L (ref 134–144)
Total Protein: 7.1 g/dL (ref 6.0–8.5)
eGFR: 73 mL/min/{1.73_m2} (ref >59)

## 2023-12-28 LAB — LIPID PANEL
Chol/HDL Ratio: 2.9 ratio (ref 0.0–4.4)
Cholesterol, Total: 175 mg/dL (ref 100–199)
HDL: 61 mg/dL (ref >39)
LDL Chol Calc (NIH): 87 mg/dL (ref 0–99)
Triglycerides: 155 mg/dL — ABNORMAL HIGH (ref 0–149)
VLDL Cholesterol Cal: 27 mg/dL (ref 5–40)

## 2023-12-28 LAB — HEMOGLOBIN A1C
Est. average glucose Bld gHb Est-mCnc: 137 mg/dL
Hgb A1c MFr Bld: 6.4 % — ABNORMAL HIGH (ref 4.8–5.6)

## 2023-12-28 LAB — VITAMIN D 25 HYDROXY (VIT D DEFICIENCY, FRACTURES): Vit D, 25-Hydroxy: 36.9 ng/mL (ref 30.0–100.0)

## 2023-12-28 LAB — TSH: TSH: 3.17 u[IU]/mL (ref 0.450–4.500)

## 2023-12-28 NOTE — Assessment & Plan Note (Signed)
The current medical regimen is effective;  continue present plan and medications. Adderal 10 mg twice daily  

## 2023-12-28 NOTE — Assessment & Plan Note (Signed)
 Previously well controlled Continue Synthroid at current dose

## 2023-12-28 NOTE — Assessment & Plan Note (Signed)
 Well controlled.  No changes to medicines. Continue Crestor 10 mg daily, vascepa 1 gm twice daily, Fenofibrate 120 mg daily. Continue to work on eating a healthy diet and exercise.  Labs drawn today.

## 2023-12-28 NOTE — Assessment & Plan Note (Signed)
 Fibromyalgia complicates management of other pain conditions. She is aware of challenges in managing nerve pain and is taking nabumetone. - Continue nabumetone for anti-inflammatory benefits. - Advise monitoring for exacerbation of fibromyalgia symptoms.

## 2023-12-28 NOTE — Assessment & Plan Note (Signed)
 A1C ordered

## 2023-12-30 ENCOUNTER — Other Ambulatory Visit: Payer: Self-pay | Admitting: Family Medicine

## 2023-12-30 DIAGNOSIS — Z0001 Encounter for general adult medical examination with abnormal findings: Secondary | ICD-10-CM

## 2024-01-02 ENCOUNTER — Other Ambulatory Visit: Payer: Self-pay | Admitting: Family Medicine

## 2024-01-14 ENCOUNTER — Other Ambulatory Visit: Payer: Self-pay

## 2024-01-18 ENCOUNTER — Other Ambulatory Visit: Payer: Self-pay | Admitting: Family Medicine

## 2024-01-18 ENCOUNTER — Other Ambulatory Visit: Payer: Self-pay

## 2024-01-18 DIAGNOSIS — E782 Mixed hyperlipidemia: Secondary | ICD-10-CM

## 2024-01-19 ENCOUNTER — Ambulatory Visit: Payer: Self-pay

## 2024-01-19 NOTE — Telephone Encounter (Signed)
 FYI Only or Action Required?: Action required by provider: medication for pain relief.  Patient was last seen in primary care on 12/27/2023 by Sherre Clapper, MD.  Called Nurse Triage reporting Foot Pain.  Symptoms began several days ago.  Interventions attempted: OTC medications: anti inflammatories, Tylenol , Nerve cream, Rest, hydration, or home remedies, and Ice/heat application.  Symptoms are: gradually worsening.  Triage Disposition: See HCP Within 4 Hours (Or PCP Triage)-requesting a phone call back  Patient/caregiver understands and will follow disposition?: No, wishes to speak with PCP  Copied from CRM [id number]. Topic: Clinical - Red Word Triage >> Jan 19, 2024 11:23 AM Nancy Armstrong wrote: Red Word that prompted transfer to Nurse Triage: Patient states she is having a lot of pain in her left foot. Dr Sherre gave her a cortisone shot previously and she has an appointment on Monday but needs to know what she can do to help with the pain over the weekend. Reason for Disposition  [1] SEVERE pain (e.g., excruciating, unable to do any normal activities) AND [2] not improved after 2 hours of pain medicine  Answer Assessment - Initial Assessment Questions 1. ONSET: When did the pain start?      Pain increased over this past week 2. LOCATION: Where is the pain located?      Left foot 3. PAIN: How bad is the pain?    (Scale 1-10; or mild, moderate, severe)     7 out of 10 4. WORK OR EXERCISE: Has there been any recent work or exercise that involved this part of the body?      Patient states she was on her feet some this past week and was out of town.  5. CAUSE: What do you think is causing the foot pain?     Possible flare up for Morton Neuroma 6. OTHER SYMPTOMS: Do you have any other symptoms? (e.g., leg pain, rash, fever, numbness)     Patient endorses burning sensation from the base of her toe to the outside aspect of her ankle bone.   Patient was diagnosed with Morton Neuroma  of the left foot in April and was given steroid shot back in April 2025. Patient is scheduled for an appointment with PCP on Monday. Patient is asking for additional pain medication to get her through the weekend. Patient states she hasn't had any success with the OTC medication this past week due to increased stress of being out of town and having to be on her feet. Patient is asking for a phone call back today  Protocols used: Foot Pain-A-AH

## 2024-01-22 ENCOUNTER — Ambulatory Visit: Admitting: Family Medicine

## 2024-01-22 ENCOUNTER — Encounter: Payer: Self-pay | Admitting: Family Medicine

## 2024-01-22 VITALS — BP 136/78 | HR 84 | Temp 98.0°F | Ht 64.0 in | Wt 221.0 lb

## 2024-01-22 DIAGNOSIS — G5762 Lesion of plantar nerve, left lower limb: Secondary | ICD-10-CM | POA: Diagnosis not present

## 2024-01-22 MED ORDER — TRAMADOL HCL 50 MG PO TABS: 50.0000 mg | ORAL_TABLET | Freq: Three times a day (TID) | ORAL | 0 refills | Status: DC | PRN

## 2024-01-22 NOTE — Patient Instructions (Signed)
 Referring to Dr. Burt.  Concerned she may need surgery.  Recommend continue diclofenac  and tylenol .  Continue wide shoes.  Start on tramadol  50 mg three times a day as needed severe pain.
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
## 2024-01-22 NOTE — Assessment & Plan Note (Signed)
 Refer to Dr. Burt.  Concerned she may need surgery.  Recommend continue diclofenac  and tylenol .  Continue wide shoes.  Start on tramadol  50 mg three times a day as needed severe pain.

## 2024-01-24 ENCOUNTER — Encounter: Payer: Self-pay | Admitting: Family Medicine

## 2024-01-26 ENCOUNTER — Encounter: Payer: Self-pay | Admitting: Advanced Practice Midwife

## 2024-01-26 ENCOUNTER — Other Ambulatory Visit: Payer: Self-pay | Admitting: Family Medicine

## 2024-01-26 MED ORDER — HYDROCODONE-ACETAMINOPHEN 5-325 MG PO TABS: 1.0000 | ORAL_TABLET | Freq: Four times a day (QID) | ORAL | 0 refills | Status: DC | PRN

## 2024-01-26 NOTE — Telephone Encounter (Signed)
 Called [employer] and they gave me another fax number because they have been having problems with their fax from the [phone]. Resend refrral. Made patient aware. Patient stated that she would like to know if she can have something for pain, being that they may not get her in till next week since they never got the rerferral. Please advise Copied from CRM [id number]. Topic: Clinical - Medical Advice >> Jan 25, 2024 10:44 AM Delon DASEN wrote: Reason for CRM: Need call back regarding left foot issue she was seen in office for- [phone]

## 2024-01-29 ENCOUNTER — Telehealth: Payer: Self-pay | Admitting: Family Medicine

## 2024-01-29 ENCOUNTER — Other Ambulatory Visit: Payer: Self-pay

## 2024-01-29 MED ORDER — HYDROCODONE-ACETAMINOPHEN 5-325 MG PO TABS: 1.0000 | ORAL_TABLET | Freq: Four times a day (QID) | ORAL | 0 refills | Status: DC | PRN

## 2024-01-29 NOTE — Telephone Encounter (Signed)
 Copied from CRM [id number]. Topic: Clinical - Prescription Issue >> Jan 29, 2024  3:45 PM Willma R wrote: Reason for CRM: Patient states [facility] will not fill the prescription for HYDROcodone -acetaminophen  (NORCO/VICODIN) 5-325 MG tablet. States they need it to have a diagnosis code. Requesting it be resent to them with the code.  Patient can be reached [phone]

## 2024-02-11 ENCOUNTER — Other Ambulatory Visit: Payer: Self-pay

## 2024-02-25 ENCOUNTER — Other Ambulatory Visit: Payer: Self-pay | Admitting: Family Medicine

## 2024-02-25 DIAGNOSIS — Z0001 Encounter for general adult medical examination with abnormal findings: Secondary | ICD-10-CM

## 2024-02-26 ENCOUNTER — Other Ambulatory Visit: Payer: Self-pay

## 2024-02-26 MED ORDER — CLONAZEPAM 0.5 MG PO TABS: 0.5000 mg | ORAL_TABLET | ORAL | 2 refills | Status: DC | PRN

## 2024-03-01 ENCOUNTER — Encounter: Payer: Self-pay | Admitting: Family Medicine

## 2024-03-04 ENCOUNTER — Other Ambulatory Visit: Payer: Self-pay | Admitting: Family Medicine

## 2024-03-04 DIAGNOSIS — Z0001 Encounter for general adult medical examination with abnormal findings: Secondary | ICD-10-CM

## 2024-03-04 DIAGNOSIS — F411 Generalized anxiety disorder: Secondary | ICD-10-CM

## 2024-03-04 MED ORDER — ZOLPIDEM TARTRATE 10 MG PO TABS: 10.0000 mg | ORAL_TABLET | Freq: Every day | ORAL | 1 refills | Status: DC

## 2024-03-04 MED ORDER — CLONAZEPAM 0.5 MG PO TABS: 0.5000 mg | ORAL_TABLET | Freq: Two times a day (BID) | ORAL | 2 refills | Status: DC | PRN

## 2024-03-09 ENCOUNTER — Other Ambulatory Visit: Payer: Self-pay

## 2024-03-20 ENCOUNTER — Ambulatory Visit: Admitting: Family Medicine

## 2024-03-20 ENCOUNTER — Encounter: Payer: Self-pay | Admitting: Family Medicine

## 2024-03-25 ENCOUNTER — Other Ambulatory Visit: Payer: Self-pay
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
## 2024-03-29 ENCOUNTER — Encounter: Payer: Self-pay | Admitting: Family Medicine

## 2024-03-29 ENCOUNTER — Ambulatory Visit: Admitting: Family Medicine

## 2024-03-29 VITALS — BP 142/98 | HR 78 | Temp 97.8°F | Ht 64.0 in | Wt 216.0 lb

## 2024-03-29 DIAGNOSIS — R7303 Prediabetes: Secondary | ICD-10-CM | POA: Diagnosis not present

## 2024-03-29 DIAGNOSIS — R030 Elevated blood-pressure reading, without diagnosis of hypertension: Secondary | ICD-10-CM | POA: Diagnosis not present

## 2024-03-29 DIAGNOSIS — R091 Pleurisy: Secondary | ICD-10-CM

## 2024-03-29 LAB — POCT GLYCOSYLATED HEMOGLOBIN (HGB A1C): HbA1c POC (<> result, manual entry): 6 % (ref 4.0–5.6)

## 2024-03-29 MED ORDER — HYDROCODONE-ACETAMINOPHEN 5-325 MG PO TABS: 1.0000 | ORAL_TABLET | Freq: Four times a day (QID) | ORAL | 0 refills | Status: DC | PRN

## 2024-03-29 MED ORDER — INDOMETHACIN 50 MG PO CAPS: 50.0000 mg | ORAL_CAPSULE | Freq: Three times a day (TID) | ORAL | 0 refills | Status: DC

## 2024-03-29 NOTE — Patient Instructions (Signed)
  VISIT SUMMARY: You visited us  today due to chest wall pain and pleurisy, which has been causing discomfort in your shoulders and back, especially when taking deep breaths. We discussed your symptoms, previous treatments, and conducted a thorough assessment to adjust your treatment plan.  YOUR PLAN: PLEURITIC CHEST PAIN: You have intermittent chest pain that radiates to your shoulders and back, which worsens with deep breathing. Previous tests have ruled out heart-related causes. -Start taking indomethacin  50 mg three times a day for one month. -Stop taking nabumetone . -We have prescribed hydrocodone  with the correct diagnosis code for pain management.                Contains text generated by [employer].                                 Contains text generated by [employer]bridge.

## 2024-03-30 DIAGNOSIS — R030 Elevated blood-pressure reading, without diagnosis of hypertension: Secondary | ICD-10-CM | POA: Insufficient documentation

## 2024-03-30 DIAGNOSIS — R091 Pleurisy: Secondary | ICD-10-CM | POA: Insufficient documentation

## 2024-03-30 NOTE — Assessment & Plan Note (Signed)
-   Prescribe indomethacin  50 mg three times a day for one month. - Discontinue nabumetone . - Prescribe hydrocodone  with appropriate diagnosis code for pain management.

## 2024-03-30 NOTE — Assessment & Plan Note (Signed)
-   Recheck blood pressure during the visit.

## 2024-04-03 ENCOUNTER — Encounter: Payer: Self-pay | Admitting: Family Medicine

## 2024-04-03 NOTE — Assessment & Plan Note (Signed)
 A1C 6.0. at goal

## 2024-04-04 ENCOUNTER — Other Ambulatory Visit: Payer: Self-pay | Admitting: Family Medicine

## 2024-04-04 DIAGNOSIS — R21 Rash and other nonspecific skin eruption: Secondary | ICD-10-CM

## 2024-04-10 ENCOUNTER — Ambulatory Visit: Admitting: Family Medicine

## 2024-04-10 ENCOUNTER — Other Ambulatory Visit

## 2024-04-15 ENCOUNTER — Other Ambulatory Visit: Payer: Self-pay | Admitting: Family Medicine

## 2024-04-15 ENCOUNTER — Encounter: Payer: Self-pay | Admitting: Family Medicine

## 2024-04-15 DIAGNOSIS — R091 Pleurisy: Secondary | ICD-10-CM

## 2024-04-15 MED ORDER — TIZANIDINE HCL 4 MG PO CAPS: 4.0000 mg | ORAL_CAPSULE | Freq: Three times a day (TID) | ORAL | 1 refills | Status: AC

## 2024-04-17 LAB — ANA,IFA RA DIAG PNL W/RFLX TIT/PATN
ANA Titer 1: NEGATIVE
Cyclic Citrullin Peptide Ab: 7 U (ref 0–19)
Rheumatoid fact SerPl-aCnc: <10 [IU]/mL (ref <14.0)

## 2024-04-18 ENCOUNTER — Other Ambulatory Visit: Payer: Self-pay

## 2024-04-18 ENCOUNTER — Ambulatory Visit: Payer: Self-pay | Admitting: Family Medicine

## 2024-04-28 ENCOUNTER — Other Ambulatory Visit: Payer: Self-pay | Admitting: Family Medicine

## 2024-05-01 ENCOUNTER — Other Ambulatory Visit: Payer: Self-pay | Admitting: Family Medicine

## 2024-05-01 DIAGNOSIS — F411 Generalized anxiety disorder: Secondary | ICD-10-CM

## 2024-05-30 ENCOUNTER — Other Ambulatory Visit: Payer: Self-pay | Admitting: Family Medicine

## 2024-05-30 DIAGNOSIS — R091 Pleurisy: Secondary | ICD-10-CM

## 2024-06-03 ENCOUNTER — Other Ambulatory Visit: Payer: Self-pay

## 2024-06-07 ENCOUNTER — Other Ambulatory Visit: Payer: Self-pay | Admitting: Family Medicine

## 2024-06-07 DIAGNOSIS — F411 Generalized anxiety disorder: Secondary | ICD-10-CM

## 2024-06-16 ENCOUNTER — Other Ambulatory Visit: Payer: Self-pay

## 2024-06-24 ENCOUNTER — Ambulatory Visit

## 2024-06-24 VITALS — BP 110/80 | HR 77 | Temp 97.1°F | Resp 16 | Ht 64.0 in | Wt 218.0 lb

## 2024-06-24 DIAGNOSIS — M25561 Pain in right knee: Secondary | ICD-10-CM | POA: Diagnosis not present

## 2024-06-24 NOTE — Progress Notes (Signed)
 Acute Office Visit  Patient ID: Nancy Armstrong, female    DOB: Jul 31, 1970, 53 y.o.   MRN: [id number]  PCP: Sherre Clapper, MD  Chief Complaint  Patient presents with   Knee Pain   Fall    Subjective:     Knee Pain   Fall    Discussed the use of AI scribe software for clinical note transcription with the patient, who gave verbal consent to proceed.  History of Present Illness   Nancy Armstrong is a 53 year old female who presents with acute right knee pain after a fall.  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 [facility] - Pain primarily localized to the top of the right knee, with additional pain on the inner side and occasional radiation down the leg - Describes 'crunchiness' in the right knee - Pain worsened by touch or pressure, but not significantly affecting ambulation or weight-bearing - No pain in the back of the knee - Bruising present on the right knee, increasing in visibility over time - History of right knee laparoscopic surgery for meniscus and cartilage debridement several years ago  Left knee symptoms - Left knee was also injured in the fall but has improved since the incident - Bruising present on the left knee  Pain management and functional impact - Using Zanaflex  (muscle relaxer) for pain control, which provides relief but causes drowsiness - Managing discomfort with rest and leg elevation  Lower back pain - Lower back pain exacerbated by the fall - Lying on her side worsens lower back pain       Review of Systems  Constitutional: Negative.   HENT: Negative.    Eyes: Negative.   Cardiovascular: Negative.   Musculoskeletal:        Right knee pain  Skin: Negative.        Objective:    BP 110/80   Pulse 77   Temp (!) 97.1 F (36.2 C)   Resp 16   Ht 5' 4 (1.626 m)   Wt 218 lb (98.9 kg)   SpO2 97%   BMI 37.42 kg/m    Physical  Exam Vitals and nursing note reviewed.  Constitutional:      Appearance: She is obese.  HENT:     Head: Normocephalic and atraumatic.  Cardiovascular:     Rate and Rhythm: Normal rate and regular rhythm.  Pulmonary:     Effort: Pulmonary effort is normal.     Breath sounds: Normal breath sounds.  Musculoskeletal:        General: Tenderness (significant superficial tenderness to the touch of right knee. Patellar tenderness and crepitus noted, ROM painful. bruise noted on the right knee) present.     Comments: Left knee bruise noted. Medial left knee joint line tenderness noted  Skin:    General: Skin is warm.  Neurological:     General: No focal deficit present.     Mental Status: She is alert.  Psychiatric:        Mood and Affect: Mood normal.       No results found for any visits on 06/24/24.     Assessment & Plan:   Problem List Items Addressed This Visit     Acute pain of right knee - Primary   Acute right knee pain Following a fall on December 11th, 2025. Pain is localized to  the suprapatellar region with crepitus and mild swelling. No significant joint line tenderness. Differential diagnosis includes patellar fracture, though less likely due to absence of severe swelling and pain. Previous imaging showed moderate arthritis with joint space narrowing. - Ordered right knee x-ray to assess for fracture - Advised rest, ice, compression, and elevation (RICE) - Continue current pain management with Tylenol  and muscle relaxer (Zanaflex ) - Instructed to monitor for worsening pain or radiating pain down the leg  Acute left knee pain Pain is less severe than the right knee and primarily located on the medial joint line. No significant swelling or joint line tenderness. Previous surgery on the right knee for meniscus and ligament repair. - Continue current pain management with Tylenol  and muscle relaxer (Zanaflex )          Relevant Orders   DG Knee Complete 4 Views Right            No orders of the defined types were placed in this encounter.   No follow-ups on file.  Kamile Fassler, MD [facility]

## 2024-06-24 NOTE — Assessment & Plan Note (Signed)
 Acute right knee pain Following a fall on December 11th, 2025. Pain is localized to the suprapatellar region with crepitus and mild swelling. No significant joint line tenderness. Differential diagnosis includes patellar fracture, though less likely due to absence of severe swelling and pain. Previous imaging showed moderate arthritis with joint space narrowing. - Ordered right knee x-ray to assess for fracture - Advised rest, ice, compression, and elevation (RICE) - Continue current pain management with Tylenol  and muscle relaxer (Zanaflex ) - Instructed to monitor for worsening pain or radiating pain down the leg  Acute left knee pain Pain is less severe than the right knee and primarily located on the medial joint line. No significant swelling or joint line tenderness. Previous surgery on the right knee for meniscus and ligament repair. - Continue current pain management with Tylenol  and muscle relaxer (Zanaflex )

## 2024-06-24 NOTE — Progress Notes (Deleted)
° °  Acute Office Visit  Subjective:     Patient ID: Nancy Armstrong, female    DOB: 1970-10-10, 53 y.o.   MRN: [id number]  Chief Complaint  Patient presents with   Knee Pain    HPI Patient is in today for ***  Review of Systems  Musculoskeletal:  Positive for joint pain (right knee pain).        Objective:    There were no vitals taken for this visit. {Vitals History (Optional):23777}  Physical Exam  No results found for any visits on 06/24/24.      Assessment & Plan:   Problem List Items Addressed This Visit   None   No orders of the defined types were placed in this encounter.   No follow-ups on file.  Alix LILLETTE Bach, CMA

## 2024-06-26 ENCOUNTER — Ambulatory Visit (HOSPITAL_BASED_OUTPATIENT_CLINIC_OR_DEPARTMENT_OTHER)
Admission: RE | Admit: 2024-06-26 | Discharge: 2024-06-26 | Disposition: A | Discharge status: Home / Self Care | Source: Ambulatory Visit | Attending: Family Medicine | Admitting: Family Medicine

## 2024-06-26 ENCOUNTER — Encounter (HOSPITAL_BASED_OUTPATIENT_CLINIC_OR_DEPARTMENT_OTHER): Payer: Self-pay | Admitting: Radiology

## 2024-06-26 DIAGNOSIS — M545 Low back pain, unspecified: Secondary | ICD-10-CM | POA: Diagnosis not present

## 2024-06-26 DIAGNOSIS — Z1231 Encounter for screening mammogram for malignant neoplasm of breast: Secondary | ICD-10-CM

## 2024-06-26 DIAGNOSIS — M25561 Pain in right knee: Secondary | ICD-10-CM

## 2024-07-01 ENCOUNTER — Encounter: Payer: Self-pay | Admitting: Family Medicine

## 2024-07-01 DIAGNOSIS — E782 Mixed hyperlipidemia: Secondary | ICD-10-CM

## 2024-07-01 DIAGNOSIS — E039 Hypothyroidism, unspecified: Secondary | ICD-10-CM

## 2024-07-01 DIAGNOSIS — R7303 Prediabetes: Secondary | ICD-10-CM

## 2024-07-08 ENCOUNTER — Ambulatory Visit: Payer: Self-pay | Admitting: Family Medicine

## 2024-07-08 ENCOUNTER — Other Ambulatory Visit: Payer: Self-pay | Admitting: Family Medicine

## 2024-07-08 DIAGNOSIS — N289 Disorder of kidney and ureter, unspecified: Secondary | ICD-10-CM

## 2024-07-09 ENCOUNTER — Other Ambulatory Visit: Admitting: Family Medicine

## 2024-07-09 VITALS — Ht 64.0 in | Wt 216.6 lb

## 2024-07-09 DIAGNOSIS — M1711 Unilateral primary osteoarthritis, right knee: Secondary | ICD-10-CM | POA: Diagnosis not present

## 2024-07-09 DIAGNOSIS — E782 Mixed hyperlipidemia: Secondary | ICD-10-CM

## 2024-07-09 DIAGNOSIS — R7303 Prediabetes: Secondary | ICD-10-CM

## 2024-07-09 DIAGNOSIS — E039 Hypothyroidism, unspecified: Secondary | ICD-10-CM | POA: Diagnosis not present

## 2024-07-09 DIAGNOSIS — N179 Acute kidney failure, unspecified: Secondary | ICD-10-CM | POA: Diagnosis not present

## 2024-07-09 LAB — CBC WITH DIFFERENTIAL/PLATELET
Basophils Absolute: 0.1 x10E3/uL (ref 0.0–0.2)
Basos: 2 %
EOS (ABSOLUTE): 0.3 x10E3/uL (ref 0.0–0.4)
Eos: 4 %
Hematocrit: 40 % (ref 34.0–46.6)
Hemoglobin: 12.9 g/dL (ref 11.1–15.9)
Immature Grans (Abs): 0 x10E3/uL (ref 0.0–0.1)
Immature Granulocytes: 0 %
Lymphocytes Absolute: 1.5 x10E3/uL (ref 0.7–3.1)
Lymphs: 24 %
MCH: 28 pg (ref 26.6–33.0)
MCHC: 32.3 g/dL (ref 31.5–35.7)
MCV: 87 fL (ref 79–97)
Monocytes Absolute: 0.5 x10E3/uL (ref 0.1–0.9)
Monocytes: 7 %
Neutrophils Absolute: 4 x10E3/uL (ref 1.4–7.0)
Neutrophils: 63 %
Platelets: 422 x10E3/uL (ref 150–450)
RBC: 4.61 x10E6/uL (ref 3.77–5.28)
RDW: 14 % (ref 11.7–15.4)
WBC: 6.4 x10E3/uL (ref 3.4–10.8)

## 2024-07-09 LAB — LIPID PANEL
Chol/HDL Ratio: 2.9 ratio (ref 0.0–4.4)
Cholesterol, Total: 152 mg/dL (ref 100–199)
HDL: 52 mg/dL
LDL Chol Calc (NIH): 78 mg/dL (ref 0–99)
Triglycerides: 122 mg/dL (ref 0–149)
VLDL Cholesterol Cal: 22 mg/dL (ref 5–40)

## 2024-07-09 LAB — COMPREHENSIVE METABOLIC PANEL WITH GFR
ALT: 27 IU/L (ref 0–32)
AST: 19 IU/L (ref 0–40)
Albumin: 4.6 g/dL (ref 3.8–4.9)
Alkaline Phosphatase: 84 IU/L (ref 49–135)
BUN/Creatinine Ratio: 13 (ref 9–23)
BUN: 12 mg/dL (ref 6–24)
Bilirubin Total: 0.3 mg/dL (ref 0.0–1.2)
CO2: 23 mmol/L (ref 20–29)
Calcium: 9.7 mg/dL (ref 8.7–10.2)
Chloride: 105 mmol/L (ref 96–106)
Creatinine, Ser: 0.89 mg/dL (ref 0.57–1.00)
Globulin, Total: 2.5 g/dL (ref 1.5–4.5)
Glucose: 119 mg/dL — ABNORMAL HIGH (ref 70–99)
Potassium: 4.3 mmol/L (ref 3.5–5.2)
Sodium: 144 mmol/L (ref 134–144)
Total Protein: 7.1 g/dL (ref 6.0–8.5)
eGFR: 77 mL/min/1.73

## 2024-07-09 LAB — HEMOGLOBIN A1C
Est. average glucose Bld gHb Est-mCnc: 146 mg/dL
Hgb A1c MFr Bld: 6.7 % — ABNORMAL HIGH (ref 4.8–5.6)

## 2024-07-09 LAB — TSH: TSH: 2.01 u[IU]/mL (ref 0.450–4.500)

## 2024-07-09 NOTE — Assessment & Plan Note (Signed)
 CHECK LABS FOR FUTURE APPOINTMENT.  Orders:   Lipid panel   Comprehensive metabolic panel with GFR   CBC with Differential/Platelet

## 2024-07-09 NOTE — Assessment & Plan Note (Signed)
  Orders:   Hemoglobin A1c
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
## 2024-07-09 NOTE — Assessment & Plan Note (Signed)
 Orders:    TSH

## 2024-07-10 ENCOUNTER — Ambulatory Visit: Payer: Self-pay | Admitting: Family Medicine

## 2024-07-10 DIAGNOSIS — N179 Acute kidney failure, unspecified: Secondary | ICD-10-CM | POA: Insufficient documentation

## 2024-07-10 NOTE — Assessment & Plan Note (Signed)
 ARTHROCENTESIS PERFORMED.

## 2024-07-10 NOTE — Assessment & Plan Note (Signed)
 RECHECK CMP. ORDER ALREADY SIGNED OFF.

## 2024-07-14 ENCOUNTER — Other Ambulatory Visit: Payer: Self-pay | Admitting: Family Medicine

## 2024-07-16 DIAGNOSIS — E66812 Obesity, class 2: Secondary | ICD-10-CM

## 2024-07-18 ENCOUNTER — Ambulatory Visit: Admitting: Family Medicine

## 2024-07-18 VITALS — BP 118/76 | HR 84 | Temp 98.2°F | Ht 64.0 in | Wt 214.4 lb

## 2024-07-18 DIAGNOSIS — Z1211 Encounter for screening for malignant neoplasm of colon: Secondary | ICD-10-CM

## 2024-07-18 DIAGNOSIS — G8929 Other chronic pain: Secondary | ICD-10-CM

## 2024-07-18 DIAGNOSIS — M549 Dorsalgia, unspecified: Secondary | ICD-10-CM | POA: Diagnosis not present

## 2024-07-18 DIAGNOSIS — M542 Cervicalgia: Secondary | ICD-10-CM

## 2024-07-18 DIAGNOSIS — M797 Fibromyalgia: Secondary | ICD-10-CM

## 2024-07-18 DIAGNOSIS — Z23 Encounter for immunization: Secondary | ICD-10-CM | POA: Diagnosis not present

## 2024-07-18 DIAGNOSIS — G43109 Migraine with aura, not intractable, without status migrainosus: Secondary | ICD-10-CM | POA: Diagnosis not present

## 2024-07-18 DIAGNOSIS — E119 Type 2 diabetes mellitus without complications: Secondary | ICD-10-CM | POA: Diagnosis not present

## 2024-07-18 DIAGNOSIS — E782 Mixed hyperlipidemia: Secondary | ICD-10-CM | POA: Diagnosis not present

## 2024-07-18 DIAGNOSIS — R091 Pleurisy: Secondary | ICD-10-CM

## 2024-07-18 MED ORDER — HYDROCODONE-ACETAMINOPHEN 5-325 MG PO TABS: 1.0000 | ORAL_TABLET | Freq: Four times a day (QID) | ORAL | 0 refills | Status: AC | PRN
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
## 2024-07-18 NOTE — Assessment & Plan Note (Signed)
-   Prescribe indomethacin  50 mg three times a day for one month. - Prescribe hydrocodone  with appropriate diagnosis code for pain management. Orders:   HYDROcodone -acetaminophen  (NORCO/VICODIN) 5-325 MG tablet; Take 1 tablet by mouth every 6 (six) hours as needed for severe pain (pain score 7-10) (pleurisy).

## 2024-07-19 ENCOUNTER — Other Ambulatory Visit

## 2024-07-19 DIAGNOSIS — E119 Type 2 diabetes mellitus without complications: Secondary | ICD-10-CM

## 2024-07-20 DIAGNOSIS — G8929 Other chronic pain: Secondary | ICD-10-CM | POA: Insufficient documentation

## 2024-07-20 DIAGNOSIS — E119 Type 2 diabetes mellitus without complications: Secondary | ICD-10-CM | POA: Insufficient documentation

## 2024-07-20 LAB — MICROALBUMIN / CREATININE URINE RATIO
Creatinine, Urine: 178.3 mg/dL
Microalb/Creat Ratio: 25 mg/g{creat} (ref 0–29)
Microalbumin, Urine: 44.1 ug/mL

## 2024-07-20 NOTE — Assessment & Plan Note (Signed)
 Chronic pain managed with hydrocodone  and indomethacin . Physical therapy has not been beneficial. Previous injections provided temporary relief. Hydrocodone  is preferred over indomethacin  for pain management. - Prescribed hydrocodone  for pain management. - Continue indomethacin  as needed. - Will consider repeat injections for pain relief. Orders:   HYDROcodone -acetaminophen  (NORCO/VICODIN) 5-325 MG tablet; Take 1 tablet by mouth every 6 (six) hours as needed for severe pain (pain score 7-10) (pleurisy).

## 2024-07-20 NOTE — Assessment & Plan Note (Signed)
-   Continue current lipid-lowering medications.

## 2024-07-20 NOTE — Assessment & Plan Note (Addendum)
 New diagnosis with A1c of 6.7%. Previous weight loss medications caused adverse effects. Current management focuses on lifestyle modifications and education. Metformin discussed as a potential future option if lifestyle changes are insufficient. - Referred to diabetes education program for diet and exercise guidance. - Provided diabetes education materials. - Will consider metformin if lifestyle modifications are insufficient. Orders:   Microalbumin / creatinine urine ratio

## 2024-07-20 NOTE — Assessment & Plan Note (Signed)
 Chronic condition with persistent symptoms. Current medications include duloxetine , indomethacin , tizanidine , and hydrocodone . Hydrocodone  provides relief despite not being a standard treatment. Amitriptyline  was discontinued but will be restarted. Discussed potential benefits of swimming for symptom management. - Restart amitriptyline . - Continue duloxetine , indomethacin , tizanidine . - Prescribed hydrocodone  for severe pain episodes. - Encouraged swimming for symptom management.

## 2024-07-20 NOTE — Assessment & Plan Note (Signed)
" °  Orders:   Pfizer Comirnaty Covid-19 Vaccine 79yrs & older  "

## 2024-07-20 NOTE — Assessment & Plan Note (Signed)
 Frequency reduced to less than once a week. Maxalt  is used as needed for acute episodes. - Continue Maxalt  as needed for migraine episodes.

## 2024-07-21 ENCOUNTER — Ambulatory Visit: Payer: Self-pay | Admitting: Family Medicine

## 2024-07-22 ENCOUNTER — Encounter: Payer: Self-pay | Admitting: Family Medicine

## 2024-07-22 DIAGNOSIS — Z1211 Encounter for screening for malignant neoplasm of colon: Secondary | ICD-10-CM | POA: Insufficient documentation

## 2024-07-22 NOTE — Assessment & Plan Note (Addendum)
 Orders:    Ambulatory referral to Gastroenterology

## 2024-07-22 NOTE — Patient Instructions (Signed)
" °  VISIT SUMMARY: Today, you had a follow-up visit to discuss your diabetes, fibromyalgia, chronic pain, migraines, and overall health. We reviewed your recent lab results and discussed your current symptoms and medications.  YOUR PLAN: TYPE 2 DIABETES MELLITUS: Your hemoglobin A1c is elevated at 6.7%, indicating diabetes. Previous weight loss medications caused side effects, so we are focusing on lifestyle changes for now. -You are referred to a diabetes education program for diet and exercise guidance. -You received diabetes education materials. -We will consider starting metformin if lifestyle changes are not enough.  FIBROMYALGIA: You have chronic fibromyalgia with persistent symptoms. Your current medications include duloxetine , indomethacin , tizanidine , and hydrocodone . -Restart amitriptyline . -Continue taking duloxetine , indomethacin , and tizanidine . -Use hydrocodone  for severe pain episodes. -Consider swimming for symptom management.  CHRONIC NECK AND LOW BACK PAIN: You have chronic neck and low back pain managed with hydrocodone  and indomethacin . Physical therapy has not been helpful. -Continue taking hydrocodone  for pain management. -Use indomethacin  as needed. -We may consider repeat injections for pain relief.  MIGRAINE: Your migraines occur less than once a week and are managed with Maxalt . -Continue using Maxalt  as needed for migraine episodes.  HYPERLIPIDEMIA: You are managing your cholesterol levels with current medications. -Continue taking your current lipid-lowering medications.  GENERAL HEALTH MAINTENANCE: You are due for flu and COVID vaccinations and a colonoscopy. -You received flu and COVID vaccinations today. -You are referred to Dr. Avram for a colonoscopy.                      Contains text generated by [employer].                                 Contains text generated by [employer]bridge.   "

## 2024-07-23 ENCOUNTER — Ambulatory Visit: Admitting: Family Medicine

## 2024-07-26 LAB — DHEA-SULFATE: DHEA-SO4: 38.6 ug/dL — ABNORMAL LOW (ref 41.2–243.7)

## 2024-07-26 LAB — DEXAMETHASONE, BLOOD: Dexamethasone, Blood: 339 ng/dL

## 2024-07-26 LAB — CORTISOL-AM, BLOOD: Cortisol - AM: 1.1 ug/dL — ABNORMAL LOW (ref 6.2–19.4)

## 2024-07-26 LAB — ACTH: ACTH: 2.5 pg/mL — ABNORMAL LOW (ref 7.2–63.3)

## 2024-07-28 ENCOUNTER — Ambulatory Visit: Payer: Self-pay | Admitting: Family Medicine

## 2024-07-29 ENCOUNTER — Other Ambulatory Visit: Payer: Self-pay | Admitting: Family Medicine

## 2024-07-29 DIAGNOSIS — R091 Pleurisy: Secondary | ICD-10-CM

## 2024-08-13 ENCOUNTER — Other Ambulatory Visit: Payer: Self-pay | Admitting: Family Medicine

## 2024-10-18 ENCOUNTER — Ambulatory Visit: Admitting: Family Medicine
